# Patient Record
Sex: Female | Born: 1960 | Race: White | Hispanic: No | Marital: Married | Smoking: Never smoker
Health system: Southern US, Community
[De-identification: ages and names within clinical notes are randomized; demographics above are authoritative.]

## PROBLEM LIST (undated history)

## (undated) DIAGNOSIS — N870 Mild cervical dysplasia: Secondary | ICD-10-CM

## (undated) DIAGNOSIS — N61 Mastitis without abscess: Secondary | ICD-10-CM

## (undated) DIAGNOSIS — F1110 Opioid abuse, uncomplicated: Secondary | ICD-10-CM

## (undated) DIAGNOSIS — R739 Hyperglycemia, unspecified: Secondary | ICD-10-CM

## (undated) DIAGNOSIS — M810 Age-related osteoporosis without current pathological fracture: Secondary | ICD-10-CM

## (undated) HISTORY — PX: TUBAL LIGATION: SHX77

## (undated) HISTORY — PX: GANGLION CYST EXCISION: SHX1691

## (undated) HISTORY — PX: ROTATOR CUFF REPAIR: SHX139

## (undated) HISTORY — PX: KNEE ARTHROSCOPY: SUR90

## (undated) HISTORY — DX: Opioid abuse, uncomplicated: F11.10

## (undated) HISTORY — DX: Hyperglycemia, unspecified: R73.9

## (undated) HISTORY — PX: MANDIBLE SURGERY: SHX707

## (undated) HISTORY — DX: Mild cervical dysplasia: N87.0

## (undated) HISTORY — DX: Age-related osteoporosis without current pathological fracture: M81.0

---
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
## 1985-09-29 HISTORY — PX: GYNECOLOGIC CRYOSURGERY: SHX857

## 1998-02-13 ENCOUNTER — Ambulatory Visit (HOSPITAL_BASED_OUTPATIENT_CLINIC_OR_DEPARTMENT_OTHER): Admission: RE | Admit: 1998-02-13 | Discharge: 1998-02-13 | Payer: Self-pay | Admitting: Orthopedic Surgery

## 1998-06-14 ENCOUNTER — Other Ambulatory Visit: Admission: RE | Admit: 1998-06-14 | Discharge: 1998-06-14 | Payer: Self-pay | Admitting: Obstetrics and Gynecology

## 1998-12-24 ENCOUNTER — Encounter: Admission: RE | Admit: 1998-12-24 | Discharge: 1999-01-09 | Payer: Self-pay | Admitting: Orthopedic Surgery

## 1999-06-18 ENCOUNTER — Other Ambulatory Visit: Admission: RE | Admit: 1999-06-18 | Discharge: 1999-06-18 | Payer: Self-pay | Admitting: Obstetrics and Gynecology

## 1999-07-03 ENCOUNTER — Encounter: Payer: Self-pay | Admitting: Obstetrics and Gynecology

## 1999-07-03 ENCOUNTER — Ambulatory Visit (HOSPITAL_COMMUNITY): Admission: RE | Admit: 1999-07-03 | Discharge: 1999-07-03 | Payer: Self-pay | Admitting: Obstetrics and Gynecology

## 2000-02-24 ENCOUNTER — Encounter: Payer: Self-pay | Admitting: Obstetrics and Gynecology

## 2000-02-24 ENCOUNTER — Encounter: Admission: RE | Admit: 2000-02-24 | Discharge: 2000-02-24 | Payer: Self-pay | Admitting: Obstetrics and Gynecology

## 2000-06-18 ENCOUNTER — Other Ambulatory Visit: Admission: RE | Admit: 2000-06-18 | Discharge: 2000-06-18 | Payer: Self-pay | Admitting: Obstetrics and Gynecology

## 2000-07-06 ENCOUNTER — Encounter: Payer: Self-pay | Admitting: Obstetrics and Gynecology

## 2000-07-06 ENCOUNTER — Encounter: Admission: RE | Admit: 2000-07-06 | Discharge: 2000-07-06 | Payer: Self-pay | Admitting: Obstetrics and Gynecology

## 2001-06-28 ENCOUNTER — Other Ambulatory Visit: Admission: RE | Admit: 2001-06-28 | Discharge: 2001-06-28 | Payer: Self-pay | Admitting: Obstetrics and Gynecology

## 2001-07-07 ENCOUNTER — Encounter: Payer: Self-pay | Admitting: Obstetrics and Gynecology

## 2001-07-07 ENCOUNTER — Encounter: Admission: RE | Admit: 2001-07-07 | Discharge: 2001-07-07 | Payer: Self-pay | Admitting: Obstetrics and Gynecology

## 2002-01-03 ENCOUNTER — Encounter: Admission: RE | Admit: 2002-01-03 | Discharge: 2002-01-03 | Payer: Self-pay | Admitting: Obstetrics and Gynecology

## 2002-01-03 ENCOUNTER — Encounter: Payer: Self-pay | Admitting: Obstetrics and Gynecology

## 2002-06-28 ENCOUNTER — Other Ambulatory Visit: Admission: RE | Admit: 2002-06-28 | Discharge: 2002-06-28 | Payer: Self-pay | Admitting: Obstetrics and Gynecology

## 2002-07-11 ENCOUNTER — Encounter: Admission: RE | Admit: 2002-07-11 | Discharge: 2002-07-11 | Payer: Self-pay | Admitting: Obstetrics and Gynecology

## 2002-07-11 ENCOUNTER — Encounter: Payer: Self-pay | Admitting: Obstetrics and Gynecology

## 2002-07-18 ENCOUNTER — Encounter: Admission: RE | Admit: 2002-07-18 | Discharge: 2002-07-18 | Payer: Self-pay | Admitting: Orthopedic Surgery

## 2002-07-18 ENCOUNTER — Encounter: Payer: Self-pay | Admitting: Orthopedic Surgery

## 2002-09-28 ENCOUNTER — Encounter: Admission: RE | Admit: 2002-09-28 | Discharge: 2002-09-28 | Payer: Self-pay | Admitting: Orthopedic Surgery

## 2002-09-28 ENCOUNTER — Encounter: Payer: Self-pay | Admitting: Orthopedic Surgery

## 2003-05-29 ENCOUNTER — Encounter: Admission: RE | Admit: 2003-05-29 | Discharge: 2003-05-29 | Payer: Self-pay | Admitting: Obstetrics and Gynecology

## 2003-05-29 ENCOUNTER — Encounter: Payer: Self-pay | Admitting: Obstetrics and Gynecology

## 2003-06-30 ENCOUNTER — Other Ambulatory Visit: Admission: RE | Admit: 2003-06-30 | Discharge: 2003-06-30 | Payer: Self-pay | Admitting: Obstetrics and Gynecology

## 2004-06-07 ENCOUNTER — Encounter: Admission: RE | Admit: 2004-06-07 | Discharge: 2004-06-07 | Payer: Self-pay | Admitting: Obstetrics and Gynecology

## 2004-06-20 ENCOUNTER — Ambulatory Visit (HOSPITAL_COMMUNITY): Admission: RE | Admit: 2004-06-20 | Discharge: 2004-06-21 | Payer: Self-pay | Admitting: Oral & Maxillofacial Surgery

## 2004-07-02 ENCOUNTER — Other Ambulatory Visit: Admission: RE | Admit: 2004-07-02 | Discharge: 2004-07-02 | Payer: Self-pay | Admitting: Obstetrics and Gynecology

## 2005-06-17 ENCOUNTER — Encounter: Admission: RE | Admit: 2005-06-17 | Discharge: 2005-06-17 | Payer: Self-pay | Admitting: Obstetrics and Gynecology

## 2005-07-29 ENCOUNTER — Other Ambulatory Visit: Admission: RE | Admit: 2005-07-29 | Discharge: 2005-07-29 | Payer: Self-pay | Admitting: Obstetrics and Gynecology

## 2006-06-22 ENCOUNTER — Encounter: Admission: RE | Admit: 2006-06-22 | Discharge: 2006-06-22 | Payer: Self-pay | Admitting: Obstetrics and Gynecology

## 2006-09-09 ENCOUNTER — Other Ambulatory Visit: Admission: RE | Admit: 2006-09-09 | Discharge: 2006-09-09 | Payer: Self-pay | Admitting: Obstetrics and Gynecology

## 2007-06-28 ENCOUNTER — Encounter: Admission: RE | Admit: 2007-06-28 | Discharge: 2007-06-28 | Payer: Self-pay | Admitting: Obstetrics and Gynecology

## 2007-10-04 ENCOUNTER — Other Ambulatory Visit: Admission: RE | Admit: 2007-10-04 | Discharge: 2007-10-04 | Payer: Self-pay | Admitting: Obstetrics and Gynecology

## 2008-06-28 ENCOUNTER — Encounter: Admission: RE | Admit: 2008-06-28 | Discharge: 2008-06-28 | Payer: Self-pay | Admitting: Obstetrics and Gynecology

## 2008-10-16 ENCOUNTER — Other Ambulatory Visit: Admission: RE | Admit: 2008-10-16 | Discharge: 2008-10-16 | Payer: Self-pay | Admitting: Obstetrics and Gynecology

## 2009-07-19 ENCOUNTER — Encounter: Admission: RE | Admit: 2009-07-19 | Discharge: 2009-07-19 | Payer: Self-pay | Admitting: Obstetrics and Gynecology

## 2009-08-28 ENCOUNTER — Ambulatory Visit (HOSPITAL_COMMUNITY): Admission: RE | Admit: 2009-08-28 | Discharge: 2009-08-28 | Payer: Self-pay | Admitting: Orthopedic Surgery

## 2009-08-29 HISTORY — PX: SHOULDER ARTHROSCOPY: SHX128

## 2009-09-25 ENCOUNTER — Ambulatory Visit (HOSPITAL_BASED_OUTPATIENT_CLINIC_OR_DEPARTMENT_OTHER): Admission: RE | Admit: 2009-09-25 | Discharge: 2009-09-25 | Payer: Self-pay | Admitting: Orthopedic Surgery

## 2009-11-21 ENCOUNTER — Encounter: Admission: RE | Admit: 2009-11-21 | Discharge: 2009-11-21 | Payer: Self-pay | Admitting: Obstetrics and Gynecology

## 2010-06-13 ENCOUNTER — Encounter: Admission: RE | Admit: 2010-06-13 | Discharge: 2010-06-13 | Payer: Self-pay | Admitting: Obstetrics and Gynecology

## 2010-10-03 ENCOUNTER — Encounter
Admission: RE | Admit: 2010-10-03 | Discharge: 2010-10-03 | Payer: Self-pay | Source: Home / Self Care | Attending: Obstetrics and Gynecology | Admitting: Obstetrics and Gynecology

## 2010-10-20 ENCOUNTER — Encounter: Payer: Self-pay | Admitting: Obstetrics and Gynecology

## 2010-12-30 LAB — POCT HEMOGLOBIN-HEMACUE: Hemoglobin: 13.2 g/dL (ref 12.0–15.0)

## 2011-02-14 NOTE — H&P (Signed)
NAMECELIA, FRIEDLAND              ACCOUNT NO.:  [id number]   MEDICAL RECORD NO.:  [id number]          PATIENT TYPE:  OIB   LOCATION:  2550                         FACILITY:  [facility]   PHYSICIAN:  Dorthula Matas, D.D.S.DATE OF BIRTH:  1960-11-05   DATE OF ADMISSION:  06/20/2004  DATE OF DISCHARGE:                                HISTORY & PHYSICAL   Traci Hoffman is a 50 year old female well known to me.  [name] has  undergone orthodontic therapy and has had problems consistently with her  temporomandibular joint.  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  After talking with her in detail about possible  surgical solutions for the skeletal dysplasia, she has elected to proceed  with total maxillary LeFort I osteotomy of the maxillae tree position and  maxilla superiorly and also bilateral mandibular sagittal split osteotomies  of the mandible to advance and rotate the mandible.  She is aware of the  multiple risks which are associated with surgery which include but are not  limited to the following risks:  swelling; bruising; nasal congestion; sinus  difficulties; infection; bruising; relapse; need for further surgical or  orthodontic care; possible continuation of temporomandibular joint problems  which may even worsen or change; facial profile changes, nasal width  increase; interoral scarring; trismus; or limited mouth opening which may be  temporary or long term; numbness of the upper lip, chin, tongue, roof of  mouth, gum tissue, lower lip, which may be temporary or long term.  The  patient does desire to proceed with planned surgical procedure, and this  will be accomplished in [facility] today under general anesthesia.  She has had a presurgical History and Physical and is in good general health  to undergo the planned surgical procedure.       SWS/MEDQ  D:  06/20/2004  T:  06/20/2004  Job:  [id number]

## 2011-02-14 NOTE — Op Note (Signed)
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   PROCEDURE:  Total maxillary LeFort I osteotomy with rigid interfixation bone  plates; bilateral mandibular sagittal split osteotomies with rigid  intermaxillary fixation using bone plates and intermaxillary fixation with  elastics.   SURGEON:  Dorthula Matas, D.D.S.   ASSISTANT:  Griffith Citron Mohorn, D.D.S.   ANESTHESIA:  General anesthesia via nasoendotracheal tube.   CULTURES:  None.   DRAINS:  NG tube and Foley catheter.   SPECIMENS:  None.   COMPLICATIONS:  None.   ESTIMATED BLOOD LOSS:  500 mL.   DESCRIPTION OF PROCEDURE:  The patient was brought to the OR and placed on  the OR table in supine position.  She was then placed under general  anesthesia and nasoendotracheal tube and nasogastric tubes were inserted.  A  Foley catheter was also inserted.  The patient was maintained under general  anesthesia for an oral maxillofacial surgical procedure.  The patient was  first prepped and draped in the usual sterile fashion for an oral  maxillofacial surgical procedure.  0.5% Marcaine with 1:200,000 epinephrine  was used to infiltrate along the mandibular ramus on the right and left and  also used to infiltrate in the maxillary buccal vestibule and give right and  left greater palatine nerve blocks and nasal palatine nerve block.  After  this was accomplished, our attention was first directed to the mandibular  left.  A sagittal  split incision was made using the Bovie cautery in the  cutting mode through the unattached mucosa overlying the anterior aspect of  the mandibular ramus and extending into the buccal vestibule of the  mandible.  At this point, this incision was deepened until the periosteal  layer was encountered.  The periosteum was then incised with a 15 blade.  A  periosteal elevator was used to reflect the periosteum along the anterior  aspect of the mandibular ramus and also to create a medial tunnel along the  medial aspect of the mandibular ramus and a vertical tunnel along the  posterior aspect of the body of the mandible laterally.  At this point the  lingula was identified and the nerve was protected.  A Lindemann bur and a  [employer] drill was used to make the medial horizontal osteotomy cut of the  ramus just above the lingula through the medial cortical bone into bleeding  medullary bone.  A sidearm Fisher bur was then used to make the sagittal  osteotomy cut and a 703 Fisher bur was used to make the vertical osteotomy  cut in the posterior body of the mandible region from  the inferior border of  the mandible to the most anterior aspect of the sagittal cut.  Once these  cuts were completed, a half pack was placed and a similar incision,  dissection, and osteotomy cuts were made on the right side.  At this point,  our attention was directed to the maxilla where a circumvestibular incision  was made in the unattached mucosa of the maxillary vestibule starting at the  right zygomatic buttress area and extending to the left zygomatic buttress  area.  The periosteum was incised and a full-thickness mucoperiosteal flap  was reflected.  The infraorbital nerve was visualized bilaterally and was  protected.  The tissue around the anterior nasal spine was released and  dissection was carried out to release the nasal mucosa from the floor of the  nose and the lateral inferior aspect of the nose.  Also a  tunneling  technique was taken from the zygomatic buttress back to the pterygomaxillary  fissure region bilaterally.  At this point, vertical marking holes were made  in the piriform rim area and also in the zygomatic buttress areas  bilaterally with the inferior superior distance being 12 mm.  The  premaxillary ligament was released.  At this point, the horizontal osteotomy  cut of the lateral maxillary wall was made starting at the left zygomatic  buttress region and extending to the piriform rim area and this was also  made on the opposite side.  Using a reciprocating saw, the osteotomy cut was  taken from the right zygomatic buttress back to the pterygomaxillary fissure  region bilaterally.  At this point the nasal septal osteotome was placed at  the inferior aspect of the nasal septum and was released.  The lateral nasal  wall was released at it's inferior extent with a lateral nasal wall septal  osteotome.  The pterygomaxillary fissure was released using the  pterygomaxillary osteotome at it's inferior extent bilaterally.  At this  point the maxilla was down-fractured.  Once the maxilla was down-fractured,  it was further mobilized using the Tessier disimpaction forceps.  The  intermediate splint was placed between the teeth and intermaxillary fixation  was applied.  The maxilla was hinged using the condyles and the glenoid  fossa as the fulcrum until first bony contact was made.  Further bony  modifications were carried out until the maxilla had been superiorly  repositioned 5 mm.  Bone along the inferior aspect of the nasal septum was  smoothed using an alveoloplasty bur.  The greater palatine vessels were  intact bilaterally.  The sinuses were irrigated well.  The inferior extent  of the cartilaginous and bony septum was trimmed back approximately 5 mm.  The nasal tissue was then repaired over the nasal septum using 4-0 chromic suture.  At this point the maxilla was again hinged  into the new superior  position and was held in place and was bone-plated in this area.  KLS 1.5 mm  bone plates were used.  The bone plates were placed in the zygomatic  buttress region bilaterally and also in the piriform rim region bilaterally.  The area was again irrigated and suctioned free of debris.  Our attention  was then directed to the mandible where the sagittal split osteotomy was  completed using small osteotomes and increasing sizes of osteotomes until  the [facility] osteotome instruments were utilized.  The inferior alveolar nerve  was noted to be intact bilaterally.  The pterygomaxillary sling stripper was  used to release the  muscle attachments from the posterior aspect of the  distal segment bilaterally.  Once this was accomplished, all previous half  packs were removed.  The patient was placed into the final intermaxillary  surgical splint and 26 gauge IMF wire loops were used to secure the maxilla  to the mandible in the final splint.  At this point, the proximal segments  of the mandible were hinged posteriorly and superiorly and seated the  condyles in the glenoid fossa using a two-point technique.  A purchase point  had been placed in the anterior aspect of the proximal segment and also the  interosseous hole in the ramus section of the mandible.  Once the proximal  segment was seated, bone plate fixation was utilized using the 2 mm KLS six-  hole bone plates.  These secured the proximal and distal segments in the new  position.  Six bone screws were placed in each of the plates.  The area was  then irrigated copiously with normal saline and suctioned free of debris.  The intermaxillary fixation was released and occlusion was checked. The  occlusion was noted to be as planned on the final surgical models.  Since  there was such a good occlusion, we did not put the final splint in place,  but instead just placed the patient into intermaxillary fixation elastics.  The  sagittal split incisions were closed with 3-0 Vicryl suture placed in a  running continuous fashion bilaterally.  The maxillary incision was closed  first with transnasalis cinch suture of 2-0 Vicryl.  A VY closure was then  carried out of the maxillary incision with the leg of the Y being at the  midline of the lip.  4-0 Vicryl was used in a continuous fashion to provide  primary closure of the maxillary incision.  At this point, the surgical  procedure was completed, the intermaxillary fixation was released.  Again  the occlusion was checked.  The oral cavity was irrigated and suctioned  free of debris and the throat pack was removed.  Intermaxillary fixation  elastics were then reapplied.  The patient was awakened in the operating  room and transferred to the recovery room where she was found to be in  satisfactory postoperative condition.       SWS/MEDQ  D:  06/20/2004  T:  06/21/2004  Job:  [id number]

## 2011-09-12 ENCOUNTER — Other Ambulatory Visit: Payer: Self-pay | Admitting: Obstetrics and Gynecology

## 2011-09-12 DIAGNOSIS — Z1231 Encounter for screening mammogram for malignant neoplasm of breast: Secondary | ICD-10-CM

## 2011-10-06 ENCOUNTER — Ambulatory Visit
Admission: RE | Admit: 2011-10-06 | Discharge: 2011-10-06 | Disposition: A | Discharge status: Home / Self Care | Payer: Self-pay | Source: Ambulatory Visit | Attending: Obstetrics and Gynecology | Admitting: Obstetrics and Gynecology

## 2011-10-06 ENCOUNTER — Other Ambulatory Visit: Payer: Self-pay | Admitting: Obstetrics and Gynecology

## 2011-10-06 DIAGNOSIS — Z1231 Encounter for screening mammogram for malignant neoplasm of breast: Secondary | ICD-10-CM

## 2011-10-06 DIAGNOSIS — N63 Unspecified lump in unspecified breast: Secondary | ICD-10-CM

## 2011-10-13 ENCOUNTER — Ambulatory Visit
Admission: RE | Admit: 2011-10-13 | Discharge: 2011-10-13 | Disposition: A | Discharge status: Home / Self Care | Payer: Managed Care, Other (non HMO) | Source: Ambulatory Visit | Attending: Obstetrics and Gynecology | Admitting: Obstetrics and Gynecology

## 2011-10-13 ENCOUNTER — Ambulatory Visit
Admission: RE | Admit: 2011-10-13 | Discharge: 2011-10-13 | Disposition: A | Discharge status: Home / Self Care | Payer: Self-pay | Source: Ambulatory Visit | Attending: Obstetrics and Gynecology | Admitting: Obstetrics and Gynecology

## 2011-10-13 ENCOUNTER — Other Ambulatory Visit: Payer: Self-pay | Admitting: Obstetrics and Gynecology

## 2011-10-13 DIAGNOSIS — N63 Unspecified lump in unspecified breast: Secondary | ICD-10-CM

## 2012-03-11 ENCOUNTER — Other Ambulatory Visit: Payer: Self-pay | Admitting: Obstetrics and Gynecology

## 2012-03-11 DIAGNOSIS — N649 Disorder of breast, unspecified: Secondary | ICD-10-CM

## 2012-04-08 ENCOUNTER — Ambulatory Visit
Admission: RE | Admit: 2012-04-08 | Discharge: 2012-04-08 | Disposition: A | Discharge status: Home / Self Care | Payer: Managed Care, Other (non HMO) | Source: Ambulatory Visit | Attending: Obstetrics and Gynecology | Admitting: Obstetrics and Gynecology

## 2012-04-08 DIAGNOSIS — N649 Disorder of breast, unspecified: Secondary | ICD-10-CM

## 2012-09-02 ENCOUNTER — Other Ambulatory Visit: Payer: Self-pay | Admitting: Obstetrics and Gynecology

## 2012-09-02 DIAGNOSIS — N6009 Solitary cyst of unspecified breast: Secondary | ICD-10-CM

## 2012-10-13 ENCOUNTER — Ambulatory Visit
Admission: RE | Admit: 2012-10-13 | Discharge: 2012-10-13 | Disposition: A | Discharge status: Home / Self Care | Payer: Medicare HMO | Source: Ambulatory Visit | Attending: Obstetrics and Gynecology | Admitting: Obstetrics and Gynecology

## 2012-10-13 DIAGNOSIS — N6009 Solitary cyst of unspecified breast: Secondary | ICD-10-CM

## 2012-11-19 LAB — HM PAP SMEAR: HM Pap smear: NEGATIVE

## 2012-12-29 LAB — HM COLONOSCOPY

## 2013-09-23 ENCOUNTER — Other Ambulatory Visit: Payer: Self-pay

## 2013-09-23 DIAGNOSIS — Z1231 Encounter for screening mammogram for malignant neoplasm of breast: Secondary | ICD-10-CM

## 2013-10-19 ENCOUNTER — Ambulatory Visit
Admission: RE | Admit: 2013-10-19 | Discharge: 2013-10-19 | Disposition: A | Discharge status: Home / Self Care | Payer: Private Health Insurance - Indemnity | Source: Ambulatory Visit

## 2013-10-19 DIAGNOSIS — Z1231 Encounter for screening mammogram for malignant neoplasm of breast: Secondary | ICD-10-CM

## 2013-10-19 LAB — HM MAMMOGRAPHY

## 2013-11-23 ENCOUNTER — Ambulatory Visit: Payer: Self-pay | Admitting: Obstetrics and Gynecology

## 2013-11-24 ENCOUNTER — Ambulatory Visit: Payer: Self-pay | Admitting: Obstetrics and Gynecology

## 2013-11-30 ENCOUNTER — Ambulatory Visit (INDEPENDENT_AMBULATORY_CARE_PROVIDER_SITE_OTHER): Payer: Managed Care, Other (non HMO) | Admitting: Obstetrics and Gynecology

## 2013-11-30 ENCOUNTER — Encounter: Payer: Self-pay | Admitting: Obstetrics and Gynecology

## 2013-11-30 VITALS — BP 100/64 | HR 80 | Resp 16 | Ht 64.0 in | Wt 137.5 lb

## 2013-11-30 DIAGNOSIS — R7309 Other abnormal glucose: Secondary | ICD-10-CM

## 2013-11-30 DIAGNOSIS — Z23 Encounter for immunization: Secondary | ICD-10-CM

## 2013-11-30 DIAGNOSIS — Z01419 Encounter for gynecological examination (general) (routine) without abnormal findings: Secondary | ICD-10-CM

## 2013-11-30 DIAGNOSIS — Z0000 Encounter for general adult medical examination without abnormal findings: Secondary | ICD-10-CM

## 2013-11-30 DIAGNOSIS — R739 Hyperglycemia, unspecified: Secondary | ICD-10-CM

## 2013-11-30 LAB — LIPID PANEL
Cholesterol: 193 mg/dL (ref 0–200)
HDL: 52 mg/dL (ref >39)
LDL Cholesterol: 114 mg/dL — ABNORMAL HIGH (ref 0–99)
Total CHOL/HDL Ratio: 3.7 Ratio
Triglycerides: 136 mg/dL (ref <150)
VLDL: 27 mg/dL (ref 0–40)

## 2013-11-30 LAB — POCT URINALYSIS DIPSTICK
Bilirubin, UA: NEGATIVE
Blood, UA: NEGATIVE
Glucose, UA: NEGATIVE
Ketones, UA: NEGATIVE
Leukocytes, UA: NEGATIVE
Nitrite, UA: NEGATIVE
Protein, UA: NEGATIVE
Urobilinogen, UA: NEGATIVE
pH, UA: 5

## 2013-11-30 LAB — COMPREHENSIVE METABOLIC PANEL
ALT: 14 U/L (ref 0–35)
AST: 18 U/L (ref 0–37)
Albumin: 4.4 g/dL (ref 3.5–5.2)
Alkaline Phosphatase: 52 U/L (ref 39–117)
BUN: 19 mg/dL (ref 6–23)
CO2: 30 mEq/L (ref 19–32)
Calcium: 9.6 mg/dL (ref 8.4–10.5)
Chloride: 104 mEq/L (ref 96–112)
Creat: 0.73 mg/dL (ref 0.50–1.10)
Glucose, Bld: 91 mg/dL (ref 70–99)
Potassium: 4.2 mEq/L (ref 3.5–5.3)
Sodium: 142 mEq/L (ref 135–145)
Total Bilirubin: 0.3 mg/dL (ref 0.2–1.2)
Total Protein: 6.8 g/dL (ref 6.0–8.3)

## 2013-11-30 LAB — CBC
HCT: 36.4 % (ref 36.0–46.0)
Hemoglobin: 12.4 g/dL (ref 12.0–15.0)
MCH: 31.2 pg (ref 26.0–34.0)
MCHC: 34.1 g/dL (ref 30.0–36.0)
MCV: 91.7 fL (ref 78.0–100.0)
Platelets: 419 10*3/uL — ABNORMAL HIGH (ref 150–400)
RBC: 3.97 MIL/uL (ref 3.87–5.11)
RDW: 13.5 % (ref 11.5–15.5)
WBC: 6.9 10*3/uL (ref 4.0–10.5)

## 2013-11-30 LAB — HEMOGLOBIN, FINGERSTICK: Hemoglobin, fingerstick: 12.6 g/dL (ref 12.0–16.0)

## 2013-11-30 MED ORDER — ESTRADIOL 2 MG VA RING: 2.0000 mg | VAGINAL_RING | VAGINAL | Status: DC

## 2013-11-30 NOTE — Patient Instructions (Signed)

## 2013-11-30 NOTE — Progress Notes (Signed)
Patient ID: Traci Hoffman, female   DOB: 12-03-1960, 53 y.o.   MRN: [id number] GYNECOLOGY VISIT  PCP:   Merri Brunette, MD  Referring provider:   HPI: 53 y.o.   Married  Caucasian  female   [phone] with No LMP recorded. Patient is postmenopausal.   here for  AEX.  Hgb A1C was 5.8 in past and wants this rechecked along with cholesterol and Vit D level. Had borderline GDM with first pregnancy.  Wants to refill Estring to treat vaginal dryness.   Hgb:    12.6 Urine:   Neg  GYNECOLOGIC HISTORY: No LMP recorded. Patient is postmenopausal. Sexually active:  yes Partner preference: female Contraception:   Tubal/postmenopausal Menopausal hormone therapy:  none  DES exposure:   no Blood transfusions: no   Sexually transmitted diseases:   no GYN procedures and prior surgeries:  Tubal ligation, Cryotherapy to cervix Last mammogram:  10-19-13 wnl:[facility].  Has had ultrasounds of the left breast in 2013 and January 2014 which documented stability of the area.       Last pap and high risk HPV testing:  11-19-12 wnl:neg HR HPV  History of abnormal pap smear:  Hx abnormal pap with colposcopy and cryotherapy to cervix In 1987.  Paps normal since.   OB History   Grav Para Term Preterm Abortions TAB SAB Ect Mult Living   4 2 2  2  2   2        LIFESTYLE: Exercise:  running           Tobacco: no Alcohol:    no Drug use: no   OTHER HEALTH MAINTENANCE: Tetanus/TDap:   2002 Gardisil:               n/a Influenza:             06/2013 Zostavax:              n/a  Bone density:       n/a Colonoscopy:      12-29-12 wnl with Dr. Loreta Ave.  Next colonoscopy due 12/2022.  Cholesterol check:   unsure  Family History  Problem Relation Age of Onset  . Adopted: Yes  . Family history unknown: Yes    There are no active problems to display for this patient.  Past Medical History  Diagnosis Date  . Narcotic abuse     --Percocet and Vicodin abuse    Past Surgical History  Procedure  Laterality Date  . Tubal ligation    . Shoulder arthroscopy Right 08/2009  . Rotator cuff repair Left   . Mandible surgery    . Gynecologic cryosurgery  1987    CIN I    ALLERGIES: Review of patient's allergies indicates no known allergies.  Current Outpatient Prescriptions  Medication Sig Dispense Refill  . calcium carbonate (OS-CAL) 600 MG TABS tablet Take 600 mg by mouth 2 (two) times daily with a meal.      . estradiol (ESTRING) 2 MG vaginal ring Place 2 mg vaginally every 3 (three) months. follow package directions      . Multiple Vitamin (MULTIVITAMIN) capsule Take 1 capsule by mouth daily.       No current facility-administered medications for this visit.     ROS:  Pertinent items are noted in HPI.  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  Becoming part of [employer].   PHYSICAL EXAMINATION:    BP 100/64  Pulse 80  Resp 16  Ht 5\' 4"  (1.626 m)  Wt 137 lb 8 oz (62.37 kg)  BMI 23.59 kg/m2   Wt Readings from Last 3 Encounters:  11/30/13 137 lb 8 oz (62.37 kg)     Ht Readings from Last 3 Encounters:  11/30/13 5\' 4"  (1.626 m)    General appearance: alert, cooperative and appears stated age Head: Normocephalic, without obvious abnormality, atraumatic Neck: no adenopathy, supple, symmetrical, trachea midline and thyroid not enlarged, symmetric, no tenderness/mass/nodules Lungs: clear to auscultation bilaterally Breasts: Inspection negative, No nipple retraction or dimpling, No nipple discharge or bleeding, No axillary or supraclavicular adenopathy, Normal to palpation without dominant masses on the right.  Left breast with 4 - 5 mm firm mass at 2 - 3 o'clock position.   Heart: regular rate and rhythm Abdomen: soft, non-tender; no masses,  no organomegaly Extremities: extremities normal, atraumatic, no cyanosis or edema Skin: Skin color, texture, turgor normal. No rashes or lesions Lymph nodes: Cervical, supraclavicular,  and axillary nodes normal. No abnormal inguinal nodes palpated Neurologic: Grossly normal  Pelvic: External genitalia:  no lesions              Urethra:  normal appearing urethra with no masses, tenderness or lesions              Bartholins and Skenes: normal                 Vagina: normal appearing vagina with normal color and discharge, no lesions              Cervix: normal appearance              Pap and high risk HPV testing done: no.            Bimanual Exam:  Uterus:  uterus is normal size, shape, consistency and nontender                                      Adnexa: normal adnexa in size, nontender and no masses                                      Rectovaginal: Confirms                                      Anus:  normal sphincter tone, no lesions  ASSESSMENT  Normal gynecologic exam. Estring patient.  Left breast mass - stable by ultrasound exams.  Remote history of cryotherapy to cervix.   PLAN  Mammogram recommended yearly.  Continue with self exams.  Pap smear and high risk HPV testing not indicated.  Check Lipid profile, CMP, HgbA1C, CBC, Vit D.  Tdap today.  Counseled on self breast exam, Calcium and vitamin D intake, exercise. Ok to refill Estring for one year.  No contraindications.  Return annually or prn   An After Visit Summary was printed and given to the patient.

## 2013-12-01 LAB — VITAMIN D 25 HYDROXY (VIT D DEFICIENCY, FRACTURES): Vit D, 25-Hydroxy: 52 ng/mL (ref 30–89)

## 2013-12-01 LAB — HEMOGLOBIN A1C
Hgb A1c MFr Bld: 6 % — ABNORMAL HIGH (ref <5.7)
Mean Plasma Glucose: 126 mg/dL — ABNORMAL HIGH (ref <117)

## 2014-04-12 ENCOUNTER — Encounter: Payer: Self-pay | Admitting: Podiatrist

## 2014-04-12 ENCOUNTER — Ambulatory Visit (INDEPENDENT_AMBULATORY_CARE_PROVIDER_SITE_OTHER): Payer: Managed Care, Other (non HMO) | Admitting: Podiatrist

## 2014-04-12 ENCOUNTER — Ambulatory Visit (INDEPENDENT_AMBULATORY_CARE_PROVIDER_SITE_OTHER): Payer: Managed Care, Other (non HMO)

## 2014-04-12 VITALS — BP 124/74 | HR 75 | Resp 18

## 2014-04-12 DIAGNOSIS — M722 Plantar fascial fibromatosis: Secondary | ICD-10-CM

## 2014-04-12 DIAGNOSIS — R52 Pain, unspecified: Secondary | ICD-10-CM

## 2014-04-12 MED ORDER — TRIAMCINOLONE ACETONIDE 10 MG/ML IJ SUSP
10.0000 mg | Freq: Once | INTRAMUSCULAR | Status: AC
  Administered 2014-04-12: 10 mg

## 2014-04-12 NOTE — Patient Instructions (Signed)

## 2014-04-12 NOTE — Progress Notes (Signed)
   Subjective:    Patient ID: Traci Hoffman, female    DOB: 1961/01/03, 53 y.o.   MRN: [id number]  HPI my right heel has been going on for about 3 weeks and burns and throbs and sore and tender and hurts on side and on the bottom and the left foot I rolled it and bruised up and rolled off a pair of shoes and that was 2 weeks ago    Review of Systems  Musculoskeletal: Positive for gait problem.       Objective:   Physical Exam Patient is awake, alert, and oriented x 3.  In no acute distress.  Vascular status is intact with palpable pedal pulses at 2/4 DP and PT bilateral and capillary refill time within normal limits. Neurological sensation is also intact bilaterally via Semmes Weinstein monofilament at 5/5 sites. Light touch, vibratory sensation, Achilles tendon reflex is intact. Dermatological exam reveals skin color, turger and texture as normal. No open lesions present.  Musculature intact with dorsiflexion, plantarflexion, inversion, eversion. Mild discomfort present lateral left ankle.   Pain on palpation plantar medial aspect of the right heel is present.  Pain in the arch region is also present. xrays show no heel spur right.  Slight heel spur right.  No sign of injury on the left foot or ankle.    Assessment & Plan:  Plantar fasciitis right, sprain left  Plan:  Injected the plantar fascia for injection number 1 with kenalog and marcaine plain under sterile technique.  She was dispensed a removable straping and scanned for orthotics.  She will be seen back when these are ready for pickup.  Advised on shoe gear changes and stretches.

## 2014-05-05 ENCOUNTER — Ambulatory Visit (INDEPENDENT_AMBULATORY_CARE_PROVIDER_SITE_OTHER): Payer: Managed Care, Other (non HMO) | Admitting: *Deleted

## 2014-05-05 DIAGNOSIS — M722 Plantar fascial fibromatosis: Secondary | ICD-10-CM

## 2014-05-05 NOTE — Patient Instructions (Signed)

## 2014-05-05 NOTE — Progress Notes (Signed)
   Subjective:    Patient ID: Traci Hoffman, female    DOB: 05-Apr-1961, 53 y.o.   MRN: [id number]  HPI PUO AND GIVEN INSTRUCTION.   Review of Systems     Objective:   Physical Exam        Assessment & Plan:

## 2014-05-24 ENCOUNTER — Encounter: Payer: Self-pay | Admitting: Podiatrist

## 2014-05-24 ENCOUNTER — Ambulatory Visit (INDEPENDENT_AMBULATORY_CARE_PROVIDER_SITE_OTHER): Payer: Managed Care, Other (non HMO) | Admitting: Podiatrist

## 2014-05-24 VITALS — BP 124/75 | HR 85 | Resp 16

## 2014-05-24 DIAGNOSIS — M722 Plantar fascial fibromatosis: Secondary | ICD-10-CM

## 2014-05-24 MED ORDER — METHYLPREDNISOLONE (PAK) 4 MG PO TABS: ORAL_TABLET | ORAL | Status: DC

## 2014-05-24 MED ORDER — TRIAMCINOLONE ACETONIDE 10 MG/ML IJ SUSP
10.0000 mg | Freq: Once | INTRAMUSCULAR | Status: AC
  Administered 2014-05-24: 10 mg

## 2014-05-24 NOTE — Progress Notes (Signed)
   Subjective: Traci Hoffman presents today for followup of right heel pain. She states that the orthotics feel too low in the arch on her right foot and too high on her left. She states that she still runs a lot and that she wishes to continue to do so. She relates that the injection helped however she continues to run and the pain has returned.  Objective: Neurovascular status is intact and unchanged with palpable pedal pulses and neurological sensation intact. Pain on palpation plantar medial aspect of the right heel is noted. Orthotics are evaluated and appear to be in okay fit however subjectively she relates there uncomfortable.  Assessment: Plantar fasciitis right  Plan: Injected the plantar fascia with Kenalog and Marcaine mixture for injection #2. We will adjust the orthotics for her. She will be seen back when these are ready for pick up. Also wrote a prescription for her to see Ellamae Sia for physical therapy. This will likely benefit her  as she does continue to want to run.

## 2014-06-21 ENCOUNTER — Telehealth: Payer: Self-pay | Admitting: *Deleted

## 2014-06-21 NOTE — Telephone Encounter (Signed)
I saw Dr. Irving Shows August 26th.  They were readjusting my inserts and I have not receive a call back that they were ready.  I'm just checking on that.  Please give me a call.

## 2014-07-07 ENCOUNTER — Ambulatory Visit: Payer: Managed Care, Other (non HMO)

## 2014-07-07 DIAGNOSIS — M722 Plantar fascial fibromatosis: Secondary | ICD-10-CM

## 2014-07-07 NOTE — Patient Instructions (Signed)
Pt is here to PUO 

## 2014-07-07 NOTE — Progress Notes (Signed)
Pt is here to PUO 

## 2014-07-20 ENCOUNTER — Telehealth: Payer: Self-pay | Admitting: Obstetrics and Gynecology

## 2014-07-20 NOTE — Telephone Encounter (Signed)
Left message regarding upcoming appointment has been canceled and needs to be rescheduled. °

## 2014-07-20 NOTE — Telephone Encounter (Signed)
Left message appointment doesn't need to be rescheduled. Error.

## 2014-07-31 ENCOUNTER — Encounter: Payer: Self-pay | Admitting: Podiatrist

## 2014-09-15 ENCOUNTER — Other Ambulatory Visit: Payer: Self-pay

## 2014-09-15 DIAGNOSIS — Z1231 Encounter for screening mammogram for malignant neoplasm of breast: Secondary | ICD-10-CM

## 2014-10-20 ENCOUNTER — Ambulatory Visit: Payer: Private Health Insurance - Indemnity

## 2014-10-26 ENCOUNTER — Ambulatory Visit
Admission: RE | Admit: 2014-10-26 | Discharge: 2014-10-26 | Disposition: A | Discharge status: Home / Self Care | Payer: 59 | Source: Ambulatory Visit

## 2014-10-26 DIAGNOSIS — Z1231 Encounter for screening mammogram for malignant neoplasm of breast: Secondary | ICD-10-CM

## 2014-12-06 ENCOUNTER — Ambulatory Visit (INDEPENDENT_AMBULATORY_CARE_PROVIDER_SITE_OTHER): Payer: Managed Care, Other (non HMO) | Admitting: Obstetrics and Gynecology

## 2014-12-06 ENCOUNTER — Ambulatory Visit: Payer: Managed Care, Other (non HMO) | Admitting: Obstetrics and Gynecology

## 2014-12-06 ENCOUNTER — Encounter: Payer: Self-pay | Admitting: Obstetrics and Gynecology

## 2014-12-06 VITALS — BP 120/70 | HR 68 | Resp 12 | Ht 63.75 in | Wt 139.2 lb

## 2014-12-06 DIAGNOSIS — Z0000 Encounter for general adult medical examination without abnormal findings: Secondary | ICD-10-CM

## 2014-12-06 DIAGNOSIS — Z01419 Encounter for gynecological examination (general) (routine) without abnormal findings: Secondary | ICD-10-CM

## 2014-12-06 LAB — COMPREHENSIVE METABOLIC PANEL
ALT: 12 U/L (ref 0–35)
AST: 16 U/L (ref 0–37)
Albumin: 4.2 g/dL (ref 3.5–5.2)
Alkaline Phosphatase: 57 U/L (ref 39–117)
BUN: 18 mg/dL (ref 6–23)
CO2: 27 mEq/L (ref 19–32)
Calcium: 9.5 mg/dL (ref 8.4–10.5)
Chloride: 101 mEq/L (ref 96–112)
Creat: 0.68 mg/dL (ref 0.50–1.10)
Glucose, Bld: 89 mg/dL (ref 70–99)
Potassium: 4.2 mEq/L (ref 3.5–5.3)
Sodium: 139 mEq/L (ref 135–145)
Total Bilirubin: 0.3 mg/dL (ref 0.2–1.2)
Total Protein: 6.9 g/dL (ref 6.0–8.3)

## 2014-12-06 LAB — POCT URINALYSIS DIPSTICK
Bilirubin, UA: NEGATIVE
Blood, UA: NEGATIVE
Glucose, UA: NEGATIVE
Ketones, UA: NEGATIVE
Leukocytes, UA: NEGATIVE
Nitrite, UA: NEGATIVE
Protein, UA: NEGATIVE
Urobilinogen, UA: NEGATIVE
pH, UA: 5

## 2014-12-06 LAB — CBC
HCT: 37.5 % (ref 36.0–46.0)
Hemoglobin: 12.7 g/dL (ref 12.0–15.0)
MCH: 31.4 pg (ref 26.0–34.0)
MCHC: 33.9 g/dL (ref 30.0–36.0)
MCV: 92.8 fL (ref 78.0–100.0)
MPV: 9.6 fL (ref 8.6–12.4)
Platelets: 312 10*3/uL (ref 150–400)
RBC: 4.04 MIL/uL (ref 3.87–5.11)
RDW: 13.1 % (ref 11.5–15.5)
WBC: 5.8 10*3/uL (ref 4.0–10.5)

## 2014-12-06 LAB — LIPID PANEL
Cholesterol: 192 mg/dL (ref 0–200)
HDL: 62 mg/dL (ref >=46)
LDL Cholesterol: 106 mg/dL — ABNORMAL HIGH (ref 0–99)
Total CHOL/HDL Ratio: 3.1 Ratio
Triglycerides: 122 mg/dL (ref <150)
VLDL: 24 mg/dL (ref 0–40)

## 2014-12-06 MED ORDER — ESTROGENS, CONJUGATED 0.625 MG/GM VA CREA: 1.0000 | TOPICAL_CREAM | Freq: Every day | VAGINAL | Status: DC

## 2014-12-06 NOTE — Addendum Note (Signed)
Addended by: Elisha HeadlandNIX, Lennyn Gange S on: 12/06/2014 05:12 PM   Modules accepted: Orders, SmartSet

## 2014-12-06 NOTE — Progress Notes (Addendum)
54 y.o. [id number] MarriedCaucasianF here for annual exam.    Using Estring. Wants to continue with this.    Is a runner.   Patient's last menstrual period was 09/29/2005.          Sexually active: Yes.    The current method of family planning is tubal ligation.    Exercising: Yes.    running Smoker:  no  Health Maintenance: Pap:  11/19/12 WNL/negative HR HPV History of abnormal Pap:  Yes h/o abnormal pap with colposcopy and cryotherapy in 1987, paps normal since MMG:  10/26/14 3D-normal Colonoscopy:  2014-repeat in 10 years BMD:   none TDaP:  06/19/13 and 2015 Screening Labs: requests CBC, A1C, Hb today: today, Urine today: negative    reports that she has never smoked. She has never used smokeless tobacco. She reports that she does not drink alcohol or use illicit drugs.  Past Medical History  Diagnosis Date  . Narcotic abuse     --Percocet and Vicodin abuse  . Elevated blood sugar     Past Surgical History  Procedure Laterality Date  . Tubal ligation    . Shoulder arthroscopy Right 08/2009  . Rotator cuff repair Left   . Mandible surgery    . Gynecologic cryosurgery  1987    CIN I    Current Outpatient Prescriptions  Medication Sig Dispense Refill  . calcium carbonate (OS-CAL) 600 MG TABS tablet Take 600 mg by mouth 2 (two) times daily with a meal. With Vitamin D    . estradiol (ESTRING) 2 MG vaginal ring Place 2 mg vaginally every 3 (three) months. follow package directions 1 each 3  . Multiple Vitamin (MULTIVITAMIN) capsule Take 1 capsule by mouth daily.    Marland Kitchen conjugated estrogens (PREMARIN) vaginal cream Place 1 Applicatorful vaginally daily. Use 1/2 g vaginally every night at bed time for the first 2 weeks, then use 1/2 g vaginally two or three times per week as needed to maintain symptom relief. 30 g 4   No current facility-administered medications for this visit.    Family History  Problem Relation Age of Onset  . Adopted: Yes    ROS:  Pertinent items are  noted in HPI.  Otherwise, a comprehensive ROS was negative.  Exam:   BP 120/70 mmHg  Pulse 68  Resp 12  Ht 5' 3.75" (1.619 m)  Wt 139 lb 3.2 oz (63.141 kg)  BMI 24.09 kg/m2  LMP 09/29/2005      Height: 5' 3.75" (161.9 cm)  Ht Readings from Last 3 Encounters:  12/06/14 5' 3.75" (1.619 m)  11/30/13  (1.626 m)    General appearance: alert, cooperative and appears stated age Head: Normocephalic, without obvious abnormality, atraumatic Neck: no adenopathy, supple, symmetrical, trachea midline and thyroid normal to inspection and palpation Lungs: clear to auscultation bilaterally Breasts: normal appearance, no masses or tenderness, Inspection negative, No nipple retraction or dimpling, No nipple discharge or bleeding, No axillary or supraclavicular adenopathy Heart: regular rate and rhythm Abdomen: soft, non-tender; bowel sounds normal; no masses,  no organomegaly Extremities: extremities normal, atraumatic, no cyanosis or edema Skin: Skin color, texture, turgor normal. No rashes or lesions Lymph nodes: Cervical, supraclavicular, and axillary nodes normal. No abnormal inguinal nodes palpated Neurologic: Grossly normal   Pelvic: External genitalia:  no lesions              Urethra:  normal appearing urethra with no masses, tenderness or lesions  Bartholins and Skenes: normal                 Vagina: normal appearing vagina with normal color and discharge, no lesions. Estring in place.               Cervix: no lesions              Pap taken: No. Bimanual Exam:  Uterus:  normal size, contour, position, consistency, mobility, non-tender              Adnexa: normal adnexa and no mass, fullness, tenderness               Rectovaginal: Confirms               Anus:  normal sphincter tone, no lesions  Chaperone was present for exam.  A:  Well Woman with normal exam Atrophic vaginal symptoms.  History of borderline gestational diabetes.   P:   Mammogram yearly.  pap  smear not indicated.  Will switch to premarin vaginal cream.  See orders.  Instructed in use.  Discussed risks of DVT, PE, MI,and stroke.  Routine labs. return annually or prn

## 2014-12-06 NOTE — Patient Instructions (Signed)

## 2014-12-07 ENCOUNTER — Encounter: Payer: Self-pay | Admitting: Obstetrics and Gynecology

## 2014-12-07 LAB — HEMOGLOBIN A1C
Hgb A1c MFr Bld: 5.9 % — ABNORMAL HIGH (ref <5.7)
Mean Plasma Glucose: 123 mg/dL — ABNORMAL HIGH (ref <117)

## 2015-01-19 ENCOUNTER — Other Ambulatory Visit: Payer: Self-pay | Admitting: Obstetrics and Gynecology

## 2015-01-19 NOTE — Telephone Encounter (Signed)
Medication refill request: Estring Last AEX:  12/06/14 Dr. Edward JollySilva Next AEX: 12/19/15 Dr. Edward JollySilva Last MMG (if hormonal medication request): 10/26/14 [id number]:Neg Refill authorized: 11/30/13 #1each / 3 Refills to [facility]   today please advise.

## 2015-01-19 NOTE — Telephone Encounter (Signed)
Called patient she states she is not using Rx anymore.  Rx denied.

## 2015-01-19 NOTE — Telephone Encounter (Signed)
Please contact patient to understand if she wants the refill of the Estring.  She switched from Estring to vaginal estrogen cream instead last year.

## 2015-05-14 ENCOUNTER — Other Ambulatory Visit: Payer: Self-pay | Admitting: Orthopedic Surgery

## 2015-05-14 DIAGNOSIS — M79672 Pain in left foot: Secondary | ICD-10-CM

## 2015-05-16 ENCOUNTER — Ambulatory Visit
Admission: RE | Admit: 2015-05-16 | Discharge: 2015-05-16 | Disposition: A | Discharge status: Home / Self Care | Payer: 59 | Source: Ambulatory Visit | Attending: Orthopedic Surgery | Admitting: Orthopedic Surgery

## 2015-05-16 ENCOUNTER — Other Ambulatory Visit (HOSPITAL_BASED_OUTPATIENT_CLINIC_OR_DEPARTMENT_OTHER): Payer: Self-pay | Admitting: Orthopedic Surgery

## 2015-05-16 DIAGNOSIS — M79672 Pain in left foot: Secondary | ICD-10-CM

## 2015-05-18 ENCOUNTER — Encounter (HOSPITAL_COMMUNITY)
Admission: RE | Admit: 2015-05-18 | Discharge: 2015-05-18 | Disposition: A | Discharge status: Home / Self Care | Payer: Managed Care, Other (non HMO) | Source: Ambulatory Visit | Attending: Orthopedic Surgery | Admitting: Orthopedic Surgery

## 2015-05-18 ENCOUNTER — Encounter (HOSPITAL_COMMUNITY): Payer: Self-pay

## 2015-05-18 DIAGNOSIS — X58XXXA Exposure to other specified factors, initial encounter: Secondary | ICD-10-CM | POA: Insufficient documentation

## 2015-05-18 DIAGNOSIS — Z01812 Encounter for preprocedural laboratory examination: Secondary | ICD-10-CM | POA: Diagnosis present

## 2015-05-18 DIAGNOSIS — S92002A Unspecified fracture of left calcaneus, initial encounter for closed fracture: Secondary | ICD-10-CM | POA: Insufficient documentation

## 2015-05-18 LAB — COMPREHENSIVE METABOLIC PANEL
ALT: 34 U/L (ref 14–54)
AST: 37 U/L (ref 15–41)
Albumin: 3.8 g/dL (ref 3.5–5.0)
Alkaline Phosphatase: 55 U/L (ref 38–126)
Anion gap: 9 (ref 5–15)
BUN: 11 mg/dL (ref 6–20)
CO2: 28 mmol/L (ref 22–32)
Calcium: 9.2 mg/dL (ref 8.9–10.3)
Chloride: 103 mmol/L (ref 101–111)
Creatinine, Ser: 0.79 mg/dL (ref 0.44–1.00)
GFR calc Af Amer: >60 mL/min (ref >60)
GFR calc non Af Amer: >60 mL/min (ref >60)
Glucose, Bld: 109 mg/dL — ABNORMAL HIGH (ref 65–99)
Potassium: 4.1 mmol/L (ref 3.5–5.1)
Sodium: 140 mmol/L (ref 135–145)
Total Bilirubin: 0.5 mg/dL (ref 0.3–1.2)
Total Protein: 6.7 g/dL (ref 6.5–8.1)

## 2015-05-18 LAB — SURGICAL PCR SCREEN
MRSA, PCR: NEGATIVE
Staphylococcus aureus: NEGATIVE

## 2015-05-18 LAB — CBC
HCT: 36.6 % (ref 36.0–46.0)
Hemoglobin: 12.2 g/dL (ref 12.0–15.0)
MCH: 31.6 pg (ref 26.0–34.0)
MCHC: 33.3 g/dL (ref 30.0–36.0)
MCV: 94.8 fL (ref 78.0–100.0)
Platelets: 294 10*3/uL (ref 150–400)
RBC: 3.86 MIL/uL — ABNORMAL LOW (ref 3.87–5.11)
RDW: 12.3 % (ref 11.5–15.5)
WBC: 4.4 10*3/uL (ref 4.0–10.5)

## 2015-05-18 NOTE — Pre-Procedure Instructions (Signed)
Traci Hoffman  05/18/2015      [id number] [address] [place] [address] Phone: [phone] Fax: [phone]    Your procedure is scheduled on  Tuesday 05/22/15  Report to [facility] Admitting at 850 A.M.  Call this number if you have problems the morning of surgery:  [phone]   Remember:  Do not eat food or drink liquids after midnight.  Take these medicines the morning of surgery with A SIP OF WATER  TYLENOL OR TRAMADOL IF NEEDED (STOP MULTIVITAMIN)    Do not wear jewelry, make-up or nail polish.  Do not wear lotions, powders, or perfumes.  You may wear deodorant.  Do not shave 48 hours prior to surgery.  Men may shave face and neck.  Do not bring valuables to the hospital.  [facility] is not responsible for any belongings or valuables.  Contacts, dentures or bridgework may not be worn into surgery.  Leave your suitcase in the car.  After surgery it may be brought to your room.  For patients admitted to the hospital, discharge time will be determined by your treatment team.  Patients discharged the day of surgery will not be allowed to drive home.   Name and phone number of your driver:    Special instructions:  [facility] - Preparing for Surgery  Before surgery, you can play an important role.  Because skin is not sterile, your skin needs to be as free of germs as possible.  You can reduce the number of germs on you skin by washing with CHG (chlorahexidine gluconate) soap before surgery.  CHG is an antiseptic cleaner which kills germs and bonds with the skin to continue killing germs even after washing.  Please DO NOT use if you have an allergy to CHG or antibacterial soaps.  If your skin becomes reddened/irritated stop using the CHG and inform your nurse when you arrive at Short Stay.  Do not shave (including legs and underarms) for at least 48 hours prior to the first CHG shower.  You may shave your  face.  Please follow these instructions carefully:   1.  Shower with CHG Soap the night before surgery and the                                morning of Surgery.  2.  If you choose to wash your hair, wash your hair first as usual with your       normal shampoo.  3.  After you shampoo, rinse your hair and body thoroughly to remove the                      Shampoo.  4.  Use CHG as you would any other liquid soap.  You can apply chg directly       to the skin and wash gently with scrungie or a clean washcloth.  5.  Apply the CHG Soap to your body ONLY FROM THE NECK DOWN.        Do not use on open wounds or open sores.  Avoid contact with your eyes,       ears, mouth and genitals (private parts).  Wash genitals (private parts)       with your normal soap.  6.  Wash thoroughly, paying special attention to the area where your surgery  will be performed.  7.  Thoroughly rinse your body with warm water from the neck down.  8.  DO NOT shower/wash with your normal soap after using and rinsing off       the CHG Soap.  9.  Pat yourself dry with a clean towel.            10.  Wear clean pajamas.            11.  Place clean sheets on your bed the night of your first shower and do not        sleep with pets.  Day of Surgery  Do not apply any lotions/deoderants the morning of surgery.  Please wear clean clothes to the hospital/surgery center.    Please read over the following fact sheets that you were given. Pain Booklet, Coughing and Deep Breathing, MRSA Information and Surgical Site Infection Prevention

## 2015-05-21 MED ORDER — CEFAZOLIN SODIUM-DEXTROSE 2-3 GM-% IV SOLR
2.0000 g | INTRAVENOUS | Status: AC
  Administered 2015-05-22: 2 g via INTRAVENOUS
  Filled 2015-05-21: qty 50

## 2015-05-22 ENCOUNTER — Ambulatory Visit (HOSPITAL_COMMUNITY): Payer: Managed Care, Other (non HMO) | Admitting: Anesthesiology

## 2015-05-22 ENCOUNTER — Encounter (HOSPITAL_COMMUNITY)
Admission: RE | Disposition: A | Discharge status: Home / Self Care | Payer: Self-pay | Source: Ambulatory Visit | Attending: Orthopedic Surgery

## 2015-05-22 ENCOUNTER — Ambulatory Visit (HOSPITAL_COMMUNITY): Payer: Managed Care, Other (non HMO)

## 2015-05-22 ENCOUNTER — Observation Stay (HOSPITAL_COMMUNITY)
Admission: RE | Admit: 2015-05-22 | Discharge: 2015-05-23 | Disposition: A | Discharge status: Home / Self Care | Payer: Managed Care, Other (non HMO) | Source: Ambulatory Visit | Attending: Orthopedic Surgery | Admitting: Orthopedic Surgery

## 2015-05-22 ENCOUNTER — Observation Stay (HOSPITAL_COMMUNITY): Payer: Managed Care, Other (non HMO)

## 2015-05-22 ENCOUNTER — Encounter (HOSPITAL_COMMUNITY): Payer: Self-pay | Admitting: Anesthesiology

## 2015-05-22 DIAGNOSIS — Z8781 Personal history of (healed) traumatic fracture: Secondary | ICD-10-CM

## 2015-05-22 DIAGNOSIS — Y9289 Other specified places as the place of occurrence of the external cause: Secondary | ICD-10-CM | POA: Insufficient documentation

## 2015-05-22 DIAGNOSIS — R200 Anesthesia of skin: Secondary | ICD-10-CM | POA: Diagnosis not present

## 2015-05-22 DIAGNOSIS — Z419 Encounter for procedure for purposes other than remedying health state, unspecified: Secondary | ICD-10-CM

## 2015-05-22 DIAGNOSIS — S92002A Unspecified fracture of left calcaneus, initial encounter for closed fracture: Principal | ICD-10-CM | POA: Diagnosis present

## 2015-05-22 DIAGNOSIS — X58XXXA Exposure to other specified factors, initial encounter: Secondary | ICD-10-CM | POA: Insufficient documentation

## 2015-05-22 DIAGNOSIS — Y9301 Activity, walking, marching and hiking: Secondary | ICD-10-CM | POA: Diagnosis not present

## 2015-05-22 DIAGNOSIS — Z9889 Other specified postprocedural states: Secondary | ICD-10-CM

## 2015-05-22 DIAGNOSIS — Z79899 Other long term (current) drug therapy: Secondary | ICD-10-CM | POA: Diagnosis not present

## 2015-05-22 HISTORY — PX: ORIF CALCANEOUS FRACTURE: SHX5030

## 2015-05-22 LAB — CREATININE, SERUM
Creatinine, Ser: 0.72 mg/dL (ref 0.44–1.00)
GFR calc Af Amer: >60 mL/min (ref >60)
GFR calc non Af Amer: >60 mL/min (ref >60)

## 2015-05-22 LAB — CBC
HCT: 34.6 % — ABNORMAL LOW (ref 36.0–46.0)
Hemoglobin: 11.8 g/dL — ABNORMAL LOW (ref 12.0–15.0)
MCH: 32 pg (ref 26.0–34.0)
MCHC: 34.1 g/dL (ref 30.0–36.0)
MCV: 93.8 fL (ref 78.0–100.0)
Platelets: 353 10*3/uL (ref 150–400)
RBC: 3.69 MIL/uL — ABNORMAL LOW (ref 3.87–5.11)
RDW: 12.3 % (ref 11.5–15.5)
WBC: 7.8 10*3/uL (ref 4.0–10.5)

## 2015-05-22 SURGERY — OPEN REDUCTION INTERNAL FIXATION (ORIF) CALCANEOUS FRACTURE
Anesthesia: General | Site: Foot | Laterality: Left

## 2015-05-22 MED ORDER — MIDAZOLAM HCL 5 MG/5ML IJ SOLN
INTRAMUSCULAR | Status: DC | PRN
  Administered 2015-05-22 (×2): 1 mg via INTRAVENOUS

## 2015-05-22 MED ORDER — BISACODYL 10 MG RE SUPP: 10.0000 mg | Freq: Every day | RECTAL | Status: DC | PRN

## 2015-05-22 MED ORDER — NEOSTIGMINE METHYLSULFATE 10 MG/10ML IV SOLN
INTRAVENOUS | Status: AC
  Filled 2015-05-22: qty 1

## 2015-05-22 MED ORDER — PROPOFOL 10 MG/ML IV BOLUS
INTRAVENOUS | Status: DC | PRN
  Administered 2015-05-22: 10 mg via INTRAVENOUS
  Administered 2015-05-22 (×2): 20 mg via INTRAVENOUS
  Administered 2015-05-22: 150 mg via INTRAVENOUS

## 2015-05-22 MED ORDER — SENNA-DOCUSATE SODIUM 8.6-50 MG PO TABS: 2.0000 | ORAL_TABLET | Freq: Every day | ORAL | Status: DC

## 2015-05-22 MED ORDER — EPHEDRINE SULFATE 50 MG/ML IJ SOLN
INTRAMUSCULAR | Status: AC
  Filled 2015-05-22: qty 1

## 2015-05-22 MED ORDER — ROCURONIUM BROMIDE 50 MG/5ML IV SOLN
INTRAVENOUS | Status: AC
  Filled 2015-05-22: qty 1

## 2015-05-22 MED ORDER — HYDROMORPHONE HCL 1 MG/ML IJ SOLN: 0.2500 mg | INTRAMUSCULAR | Status: DC | PRN

## 2015-05-22 MED ORDER — MULTIVITAMINS PO CAPS: 1.0000 | ORAL_CAPSULE | Freq: Every day | ORAL | Status: DC

## 2015-05-22 MED ORDER — MIDAZOLAM HCL 2 MG/2ML IJ SOLN
INTRAMUSCULAR | Status: AC
  Administered 2015-05-22: 2 mg
  Filled 2015-05-22: qty 2

## 2015-05-22 MED ORDER — METOCLOPRAMIDE HCL 5 MG/ML IJ SOLN: 5.0000 mg | Freq: Three times a day (TID) | INTRAMUSCULAR | Status: DC | PRN

## 2015-05-22 MED ORDER — PROPOFOL 10 MG/ML IV BOLUS
INTRAVENOUS | Status: AC
  Filled 2015-05-22: qty 20

## 2015-05-22 MED ORDER — PROMETHAZINE HCL 25 MG/ML IJ SOLN: 6.2500 mg | INTRAMUSCULAR | Status: DC | PRN

## 2015-05-22 MED ORDER — ARTIFICIAL TEARS OP OINT
TOPICAL_OINTMENT | OPHTHALMIC | Status: AC
  Filled 2015-05-22: qty 3.5

## 2015-05-22 MED ORDER — ACETAMINOPHEN 325 MG PO TABS: 650.0000 mg | ORAL_TABLET | Freq: Four times a day (QID) | ORAL | Status: DC | PRN

## 2015-05-22 MED ORDER — POTASSIUM CHLORIDE IN NACL 20-0.45 MEQ/L-% IV SOLN
INTRAVENOUS | Status: DC
  Administered 2015-05-22: 19:00:00 via INTRAVENOUS
  Filled 2015-05-22 (×2): qty 1000

## 2015-05-22 MED ORDER — ENOXAPARIN SODIUM 40 MG/0.4ML ~~LOC~~ SOLN
40.0000 mg | SUBCUTANEOUS | Status: DC
  Administered 2015-05-23: 40 mg via SUBCUTANEOUS
  Filled 2015-05-22: qty 0.4

## 2015-05-22 MED ORDER — DOCUSATE SODIUM 100 MG PO CAPS
100.0000 mg | ORAL_CAPSULE | Freq: Two times a day (BID) | ORAL | Status: DC
  Administered 2015-05-22 – 2015-05-23 (×2): 100 mg via ORAL
  Filled 2015-05-22 (×2): qty 1

## 2015-05-22 MED ORDER — OXYCODONE HCL 10 MG PO TABS: 10.0000 mg | ORAL_TABLET | Freq: Four times a day (QID) | ORAL | Status: DC | PRN

## 2015-05-22 MED ORDER — LACTATED RINGERS IV SOLN
INTRAVENOUS | Status: DC
  Administered 2015-05-22 (×3): via INTRAVENOUS

## 2015-05-22 MED ORDER — FENTANYL CITRATE (PF) 100 MCG/2ML IJ SOLN
INTRAMUSCULAR | Status: DC | PRN
  Administered 2015-05-22 (×2): 50 ug via INTRAVENOUS

## 2015-05-22 MED ORDER — MAGNESIUM CITRATE PO SOLN: 1.0000 | Freq: Once | ORAL | Status: DC | PRN

## 2015-05-22 MED ORDER — BUPIVACAINE-EPINEPHRINE (PF) 0.5% -1:200000 IJ SOLN
INTRAMUSCULAR | Status: DC | PRN
  Administered 2015-05-22: 30 mL via PERINEURAL

## 2015-05-22 MED ORDER — ARTIFICIAL TEARS OP OINT
TOPICAL_OINTMENT | OPHTHALMIC | Status: DC | PRN
  Administered 2015-05-22: 1 via OPHTHALMIC

## 2015-05-22 MED ORDER — SODIUM CHLORIDE 0.9 % IJ SOLN
INTRAMUSCULAR | Status: AC
  Filled 2015-05-22: qty 10

## 2015-05-22 MED ORDER — POLYETHYLENE GLYCOL 3350 17 G PO PACK: 17.0000 g | PACK | Freq: Every day | ORAL | Status: DC | PRN

## 2015-05-22 MED ORDER — TRAMADOL HCL 50 MG PO TABS: 50.0000 mg | ORAL_TABLET | Freq: Two times a day (BID) | ORAL | Status: DC | PRN

## 2015-05-22 MED ORDER — VITAMIN D 1000 UNITS PO TABS
1000.0000 [IU] | ORAL_TABLET | Freq: Every day | ORAL | Status: DC
  Administered 2015-05-23: 1000 [IU] via ORAL
  Filled 2015-05-22: qty 1

## 2015-05-22 MED ORDER — DEXAMETHASONE SODIUM PHOSPHATE 10 MG/ML IJ SOLN
INTRAMUSCULAR | Status: AC
  Filled 2015-05-22: qty 1

## 2015-05-22 MED ORDER — CEFAZOLIN SODIUM 1-5 GM-% IV SOLN
1.0000 g | Freq: Four times a day (QID) | INTRAVENOUS | Status: AC
  Administered 2015-05-22 – 2015-05-23 (×3): 1 g via INTRAVENOUS
  Filled 2015-05-22 (×3): qty 50

## 2015-05-22 MED ORDER — METOCLOPRAMIDE HCL 5 MG PO TABS: 5.0000 mg | ORAL_TABLET | Freq: Three times a day (TID) | ORAL | Status: DC | PRN

## 2015-05-22 MED ORDER — 0.9 % SODIUM CHLORIDE (POUR BTL) OPTIME
TOPICAL | Status: DC | PRN
  Administered 2015-05-22: 1000 mL

## 2015-05-22 MED ORDER — LIDOCAINE HCL (CARDIAC) 20 MG/ML IV SOLN
INTRAVENOUS | Status: DC | PRN
  Administered 2015-05-22: 75 mg via INTRAVENOUS

## 2015-05-22 MED ORDER — GLYCOPYRROLATE 0.2 MG/ML IJ SOLN
INTRAMUSCULAR | Status: AC
  Filled 2015-05-22: qty 1

## 2015-05-22 MED ORDER — DIPHENHYDRAMINE HCL 12.5 MG/5ML PO ELIX: 12.5000 mg | ORAL_SOLUTION | ORAL | Status: DC | PRN

## 2015-05-22 MED ORDER — DEXAMETHASONE SODIUM PHOSPHATE 10 MG/ML IJ SOLN
INTRAMUSCULAR | Status: DC | PRN
  Administered 2015-05-22: 10 mg via INTRAVENOUS

## 2015-05-22 MED ORDER — METHOCARBAMOL 1000 MG/10ML IJ SOLN
500.0000 mg | Freq: Four times a day (QID) | INTRAVENOUS | Status: DC | PRN
  Filled 2015-05-22: qty 5

## 2015-05-22 MED ORDER — KETOROLAC TROMETHAMINE 15 MG/ML IJ SOLN
15.0000 mg | Freq: Four times a day (QID) | INTRAMUSCULAR | Status: DC
  Administered 2015-05-22 – 2015-05-23 (×3): 15 mg via INTRAVENOUS
  Filled 2015-05-22 (×3): qty 1

## 2015-05-22 MED ORDER — ONDANSETRON HCL 4 MG/2ML IJ SOLN
4.0000 mg | Freq: Four times a day (QID) | INTRAMUSCULAR | Status: DC | PRN
Start: 2015-05-22 — End: 2015-05-23

## 2015-05-22 MED ORDER — CALCIUM CARBONATE-VITAMIN D 500-200 MG-UNIT PO TABS
1.0000 | ORAL_TABLET | Freq: Every day | ORAL | Status: DC
  Administered 2015-05-23: 1 via ORAL
  Filled 2015-05-22: qty 1

## 2015-05-22 MED ORDER — SUCCINYLCHOLINE CHLORIDE 20 MG/ML IJ SOLN
INTRAMUSCULAR | Status: AC
  Filled 2015-05-22: qty 1

## 2015-05-22 MED ORDER — LIDOCAINE HCL (CARDIAC) 20 MG/ML IV SOLN
INTRAVENOUS | Status: AC
  Filled 2015-05-22: qty 5

## 2015-05-22 MED ORDER — HYDROMORPHONE HCL 1 MG/ML IJ SOLN
1.0000 mg | INTRAMUSCULAR | Status: DC | PRN
  Administered 2015-05-23: 1 mg via INTRAVENOUS
  Filled 2015-05-22: qty 1

## 2015-05-22 MED ORDER — BACLOFEN 10 MG PO TABS: 10.0000 mg | ORAL_TABLET | Freq: Three times a day (TID) | ORAL | Status: DC

## 2015-05-22 MED ORDER — CALCIUM CARB-CHOLECALCIFEROL 600-200 MG-UNIT PO TABS: ORAL_TABLET | Freq: Every day | ORAL | Status: DC

## 2015-05-22 MED ORDER — ADULT MULTIVITAMIN W/MINERALS CH
1.0000 | ORAL_TABLET | Freq: Every day | ORAL | Status: DC
  Administered 2015-05-23: 1 via ORAL
  Filled 2015-05-22: qty 1

## 2015-05-22 MED ORDER — GLYCOPYRROLATE 0.2 MG/ML IJ SOLN
INTRAMUSCULAR | Status: AC
  Filled 2015-05-22: qty 4

## 2015-05-22 MED ORDER — FENTANYL CITRATE (PF) 250 MCG/5ML IJ SOLN
INTRAMUSCULAR | Status: AC
  Filled 2015-05-22: qty 5

## 2015-05-22 MED ORDER — ONDANSETRON HCL 4 MG PO TABS: 4.0000 mg | ORAL_TABLET | Freq: Three times a day (TID) | ORAL | Status: DC | PRN

## 2015-05-22 MED ORDER — MIDAZOLAM HCL 2 MG/2ML IJ SOLN
INTRAMUSCULAR | Status: AC
  Filled 2015-05-22: qty 4

## 2015-05-22 MED ORDER — EPHEDRINE SULFATE 50 MG/ML IJ SOLN
INTRAMUSCULAR | Status: DC | PRN
Start: 2015-05-22 — End: 2015-05-22
  Administered 2015-05-22: 15 mg via INTRAVENOUS
  Administered 2015-05-22: 10 mg via INTRAVENOUS

## 2015-05-22 MED ORDER — SENNA 8.6 MG PO TABS
1.0000 | ORAL_TABLET | Freq: Two times a day (BID) | ORAL | Status: DC
  Administered 2015-05-22 – 2015-05-23 (×2): 8.6 mg via ORAL
  Filled 2015-05-22 (×2): qty 1

## 2015-05-22 MED ORDER — FENTANYL CITRATE (PF) 100 MCG/2ML IJ SOLN
INTRAMUSCULAR | Status: AC
  Administered 2015-05-22: 100 ug
  Filled 2015-05-22: qty 2

## 2015-05-22 MED ORDER — METHOCARBAMOL 500 MG PO TABS: 500.0000 mg | ORAL_TABLET | Freq: Four times a day (QID) | ORAL | Status: DC | PRN

## 2015-05-22 MED ORDER — ONDANSETRON HCL 4 MG PO TABS: 4.0000 mg | ORAL_TABLET | Freq: Four times a day (QID) | ORAL | Status: DC | PRN

## 2015-05-22 MED ORDER — ONDANSETRON HCL 4 MG/2ML IJ SOLN
INTRAMUSCULAR | Status: AC
  Filled 2015-05-22: qty 2

## 2015-05-22 MED ORDER — SUCCINYLCHOLINE CHLORIDE 20 MG/ML IJ SOLN
INTRAMUSCULAR | Status: DC | PRN
  Administered 2015-05-22: 90 mg via INTRAVENOUS

## 2015-05-22 MED ORDER — ACETAMINOPHEN 650 MG RE SUPP: 650.0000 mg | Freq: Four times a day (QID) | RECTAL | Status: DC | PRN

## 2015-05-22 MED ORDER — OXYCODONE HCL 5 MG PO TABS
5.0000 mg | ORAL_TABLET | ORAL | Status: DC | PRN
  Administered 2015-05-22: 5 mg via ORAL
  Administered 2015-05-23 (×2): 10 mg via ORAL
  Filled 2015-05-22: qty 2
  Filled 2015-05-22: qty 1
  Filled 2015-05-22: qty 2

## 2015-05-22 SURGICAL SUPPLY — 78 items
BANDAGE ELASTIC 4 VELCRO ST LF (GAUZE/BANDAGES/DRESSINGS) ×2 IMPLANT
BANDAGE ELASTIC 6 VELCRO ST LF (GAUZE/BANDAGES/DRESSINGS) ×4 IMPLANT
BANDAGE ESMARK 6X9 LF (GAUZE/BANDAGES/DRESSINGS) IMPLANT
BIT DRILL 2.5X2.75 QC CALB (BIT) ×2 IMPLANT
BNDG CMPR 9X6 STRL LF SNTH (GAUZE/BANDAGES/DRESSINGS) ×1
BNDG ESMARK 6X9 LF (GAUZE/BANDAGES/DRESSINGS) ×3
BONE CANC CHIPS 20CC PCAN1/4 (Bone Implant) ×3 IMPLANT
BOOTCOVER CLEANROOM LRG (PROTECTIVE WEAR) ×2 IMPLANT
CHIPS CANC BONE 20CC PCAN1/4 (Bone Implant) ×1 IMPLANT
COVER SURGICAL LIGHT HANDLE (MISCELLANEOUS) ×3 IMPLANT
CUFF TOURNIQUET SINGLE 34IN LL (TOURNIQUET CUFF) ×2 IMPLANT
CUFF TOURNIQUET SINGLE 44IN (TOURNIQUET CUFF) IMPLANT
DECANTER SPIKE VIAL GLASS SM (MISCELLANEOUS) IMPLANT
DRAPE C-ARM 42X72 X-RAY (DRAPES) ×2 IMPLANT
DRAPE C-ARMOR (DRAPES) ×2 IMPLANT
DRAPE U-SHAPE 47X51 STRL (DRAPES) ×2 IMPLANT
DRSG PAD ABDOMINAL 8X10 ST (GAUZE/BANDAGES/DRESSINGS) ×3 IMPLANT
DURAPREP 26ML APPLICATOR (WOUND CARE) ×3 IMPLANT
ELECT REM PT RETURN 9FT ADLT (ELECTROSURGICAL) ×3
ELECTRODE REM PT RTRN 9FT ADLT (ELECTROSURGICAL) ×1 IMPLANT
GAUZE SPONGE 4X4 12PLY STRL (GAUZE/BANDAGES/DRESSINGS) ×3 IMPLANT
GAUZE XEROFORM 1X8 LF (GAUZE/BANDAGES/DRESSINGS) ×4 IMPLANT
GLOVE BIO SURGEON STRL SZ 6.5 (GLOVE) ×1 IMPLANT
GLOVE BIO SURGEONS STRL SZ 6.5 (GLOVE) ×1
GLOVE BIOGEL M 7.0 STRL (GLOVE) ×2 IMPLANT
GLOVE BIOGEL PI IND STRL 7.0 (GLOVE) IMPLANT
GLOVE BIOGEL PI IND STRL 8 (GLOVE) IMPLANT
GLOVE BIOGEL PI INDICATOR 7.0 (GLOVE) ×4
GLOVE BIOGEL PI INDICATOR 8 (GLOVE) ×2
GLOVE BIOGEL PI ORTHO PRO SZ8 (GLOVE) ×2
GLOVE ORTHO TXT STRL SZ7.5 (GLOVE) ×3 IMPLANT
GLOVE PI ORTHO PRO STRL SZ8 (GLOVE) ×1 IMPLANT
GLOVE SURG ORTHO 8.0 STRL STRW (GLOVE) ×6 IMPLANT
GLOVE SURG SS PI 8.0 STRL IVOR (GLOVE) ×4 IMPLANT
GOWN STRL REUS W/ TWL LRG LVL3 (GOWN DISPOSABLE) IMPLANT
GOWN STRL REUS W/TWL LRG LVL3 (GOWN DISPOSABLE)
GRAFT BNE CANC CHIPS 1-8 20CC (Bone Implant) IMPLANT
K-WIRE ACE 1.6X6 (WIRE) ×12
K-WIRE FIXATION 2.0X6 (WIRE) ×3
KIT BASIN OR (CUSTOM PROCEDURE TRAY) ×3 IMPLANT
KIT ROOM TURNOVER OR (KITS) ×3 IMPLANT
KWIRE ACE 1.6X6 (WIRE) IMPLANT
KWIRE FIXATION 2.0X6 (WIRE) IMPLANT
MANIFOLD NEPTUNE II (INSTRUMENTS) ×3 IMPLANT
NS IRRIG 1000ML POUR BTL (IV SOLUTION) ×3 IMPLANT
PACK ORTHO EXTREMITY (CUSTOM PROCEDURE TRAY) ×3 IMPLANT
PAD ABD 8X10 STRL (GAUZE/BANDAGES/DRESSINGS) ×2 IMPLANT
PAD ARMBOARD 7.5X6 YLW CONV (MISCELLANEOUS) ×6 IMPLANT
PAD CAST 4YDX4 CTTN HI CHSV (CAST SUPPLIES) ×2 IMPLANT
PADDING CAST COTTON 4X4 STRL (CAST SUPPLIES) ×6
PADDING CAST COTTON 6X4 STRL (CAST SUPPLIES) ×2 IMPLANT
PIN 4.5MM CANC THREADED AO (PIN) ×2 IMPLANT
PLATE LOCK SM LT CALC FOOT (Plate) ×2 IMPLANT
SCREW CORT 3.5X32 (Screw) ×6 IMPLANT
SCREW CORT 3.5X40 (Screw) ×2 IMPLANT
SCREW CORT T15 28X3.5XST LCK (Screw) IMPLANT
SCREW CORT T15 30X3.5XST LCK (Screw) IMPLANT
SCREW CORT T15 32X3.5XST LCK (Screw) IMPLANT
SCREW CORTICAL 3.5X28MM (Screw) ×3 IMPLANT
SCREW CORTICAL 3.5X30MM (Screw) ×6 IMPLANT
SCREW LP 3.5X38MM (Screw) ×4 IMPLANT
SCREW NL LP 36X3.5 (Screw) ×2 IMPLANT
SPLINT PLASTER CAST XFAST 5X30 (CAST SUPPLIES) IMPLANT
SPLINT PLASTER XFAST SET 5X30 (CAST SUPPLIES) ×2
SPONGE GAUZE 4X4 12PLY STER LF (GAUZE/BANDAGES/DRESSINGS) ×2 IMPLANT
SPONGE LAP 4X18 X RAY DECT (DISPOSABLE) ×4 IMPLANT
SUCTION FRAZIER TIP 10 FR DISP (SUCTIONS) ×3 IMPLANT
SUT ETHILON 3 0 PS 1 (SUTURE) ×4 IMPLANT
SUT VIC AB 0 CTB1 27 (SUTURE) ×6 IMPLANT
SUT VIC AB 2-0 CTB1 (SUTURE) ×6 IMPLANT
SYR CONTROL 10ML LL (SYRINGE) IMPLANT
TOWEL OR 17X24 6PK STRL BLUE (TOWEL DISPOSABLE) ×3 IMPLANT
TOWEL OR 17X26 10 PK STRL BLUE (TOWEL DISPOSABLE) ×3 IMPLANT
TUBE CONNECTING 12'X1/4 (SUCTIONS) ×1
TUBE CONNECTING 12X1/4 (SUCTIONS) ×2 IMPLANT
UNDERPAD 30X30 INCONTINENT (UNDERPADS AND DIAPERS) ×3 IMPLANT
WATER STERILE IRR 1000ML POUR (IV SOLUTION) ×3 IMPLANT
YANKAUER SUCT BULB TIP NO VENT (SUCTIONS) ×2 IMPLANT

## 2015-05-22 NOTE — H&P (Signed)
PREOPERATIVE H&P  Chief Complaint: left CALCANEAL FRACTURE  HPI: Traci Hoffman is a 54 y.o. female who presents for preoperative history and physical with a diagnosis of CALCANEAL FRACTURE. Symptoms are rated as moderate to severe, and have been worsening.  This is significantly impairing activities of daily living.  She has elected for surgical management. This occurred 05/12/2015, when she stepped off a curb in [place]. She had acute onset 10/10 pain unable to walk. She flew back home, and has been using Tylenol to try and avoid narcotics because of history of dependency. She normally is a runner, and has pain directly over the left heel.   Past Medical History  Diagnosis Date  . Narcotic abuse     --Percocet and Vicodin abuse  . Elevated blood sugar    Past Surgical History  Procedure Laterality Date  . Tubal ligation    . Shoulder arthroscopy Right 08/2009  . Rotator cuff repair Left   . Mandible surgery    . Gynecologic cryosurgery  1987    CIN I  . Ganglion cyst excision      LEFT  . Knee arthroscopy      1980   Social History   Social History  . Marital Status: Married    Spouse Name: N/A  . Number of Children: N/A  . Years of Education: N/A   Social History Main Topics  . Smoking status: Never Smoker   . Smokeless tobacco: Never Used  . Alcohol Use: No  . Drug Use: No  . Sexual Activity:    Partners: Male    Birth Control/ Protection: Post-menopausal, Surgical     Comment: Tubal   Other Topics Concern  . Not on file   Social History Narrative   Family History  Problem Relation Age of Onset  . Adopted: Yes   No Known Allergies Prior to Admission medications   Medication Sig Start Date End Date Taking? Authorizing Provider  acetaminophen (TYLENOL) 500 MG tablet Take 1,000 mg by mouth every 8 (eight) hours as needed for mild pain or moderate pain.   Yes Historical Provider, MD  Calcium Carb-Cholecalciferol (CALCIUM 600 + D PO) Take 1 tablet by mouth  daily.   Yes Historical Provider, MD  cholecalciferol (VITAMIN D) 1000 UNITS tablet Take 1,000 Units by mouth daily.   Yes Historical Provider, MD  Multiple Vitamin (MULTIVITAMIN) capsule Take 1 capsule by mouth daily.   Yes Historical Provider, MD  traMADol (ULTRAM) 50 MG tablet Take 50 mg by mouth every 12 (twelve) hours as needed.   Yes Historical Provider, MD     Positive ROS: All other systems have been reviewed and were otherwise negative with the exception of those mentioned in the HPI and as above.  Physical Exam: General: Alert, no acute distress Cardiovascular: No pedal edema Respiratory: No cyanosis, no use of accessory musculature GI: No organomegaly, abdomen is soft and non-tender Skin: No lesions in the area of chief complaint Neurologic: Sensation intact distally Psychiatric: Patient is competent for consent with normal mood and affect Lymphatic: No axillary or cervical lymphadenopathy  MUSCULOSKELETAL: Left foot has sensation intact throughout, good capillary refill, positive pain to palpation over the calcaneus, acceptable soft tissue swelling.  Assessment: Left CALCANEAL FRACTURE With displacement, comminution  Plan: Plan for Procedure(s): OPEN REDUCTION INTERNAL FIXATION (ORIF) CALCANEOUS FRACTURE  The risks benefits and alternatives were discussed with the patient including but not limited to the risks of nonoperative treatment, versus surgical intervention including infection, bleeding, nerve injury,  malunion, nonunion, the need for revision surgery, hardware prominence, hardware failure, the need for hardware removal, blood clots, cardiopulmonary complications, morbidity, mortality, among others, and they were willing to proceed.   We also discussed the risks for posttraumatic arthrosis, persistent hindfoot stiffness, difficulties with shoe wear, among others and hopefully we will be able to optimize these through surgical intervention.  Eulas Post, MD Cell  [phone]   05/22/2015 9:26 AM

## 2015-05-22 NOTE — Anesthesia Postprocedure Evaluation (Signed)
  Anesthesia Post-op Note  Patient: Traci Hoffman  Procedure(s) Performed: Procedure(s): OPEN REDUCTION INTERNAL FIXATION (ORIF) CALCANEOUS FRACTURE (Left)  Patient Location: PACU  Anesthesia Type:GA combined with regional for post-op pain  Level of Consciousness: awake and alert   Airway and Oxygen Therapy: Patient Spontanous Breathing  Post-op Pain: none  Post-op Assessment: Post-op Vital signs reviewed LLE Motor Response: No movement due to regional block LLE Sensation: Numbness (had block)          Post-op Vital Signs: stable  Last Vitals:  Filed Vitals:   05/22/15 1421  BP:   Pulse: 99  Temp:   Resp: 16    Complications: No apparent anesthesia complications

## 2015-05-22 NOTE — Anesthesia Preprocedure Evaluation (Signed)
Anesthesia Evaluation  Patient identified by MRN, date of birth, ID band Patient awake    Reviewed: Allergy & Precautions, H&P , NPO status , Patient's Chart, lab work & pertinent test results  History of Anesthesia Complications Negative for: history of anesthetic complications  Airway Mallampati: I       Dental no notable dental hx. (+) Teeth Intact   Pulmonary neg pulmonary ROS,  breath sounds clear to auscultation  Pulmonary exam normal       Cardiovascular negative cardio ROS  IRhythm:Regular Rate:Normal     Neuro/Psych negative neurological ROS  negative psych ROS   GI/Hepatic negative GI ROS, Neg liver ROS,   Endo/Other  negative endocrine ROS  Renal/GU negative Renal ROS  negative genitourinary   Musculoskeletal   Abdominal   Peds  Hematology negative hematology ROS (+)   Anesthesia Other Findings   Reproductive/Obstetrics negative OB ROS                             Anesthesia Physical Anesthesia Plan  ASA: I  Anesthesia Plan: General   Post-op Pain Management: GA combined w/ Regional for post-op pain   Induction: Intravenous  Airway Management Planned: LMA  Additional Equipment:   Intra-op Plan:   Post-operative Plan:   Informed Consent: I have reviewed the patients History and Physical, chart, labs and discussed the procedure including the risks, benefits and alternatives for the proposed anesthesia with the patient or authorized representative who has indicated his/her understanding and acceptance.     Plan Discussed with: CRNA and Surgeon  Anesthesia Plan Comments:         Anesthesia Quick Evaluation

## 2015-05-22 NOTE — Transfer of Care (Signed)
Immediate Anesthesia Transfer of Care Note  Patient: Traci Hoffman  Procedure(s) Performed: Procedure(s): OPEN REDUCTION INTERNAL FIXATION (ORIF) CALCANEOUS FRACTURE (Left)  Patient Location: PACU  Anesthesia Type:General and Regional  Level of Consciousness: awake, alert , oriented and sedated  Airway & Oxygen Therapy: Patient Spontanous Breathing and Patient connected to nasal cannula oxygen  Post-op Assessment: Report given to RN, Post -op Vital signs reviewed and stable and Patient moving all extremities  Post vital signs: Reviewed and stable  Last Vitals:  Filed Vitals:   05/22/15 0959  BP: 124/53  Pulse: 102  Temp:   Resp: 24    Complications: No apparent anesthesia complications

## 2015-05-22 NOTE — Discharge Instructions (Signed)
Diet: As you were doing prior to hospitalization   Shower:  May shower but keep the wounds dry, use an occlusive plastic wrap, NO SOAKING IN TUB.  If the bandage gets wet, change with a clean dry gauze.  If you have a splint on, leave the splint in place and keep the splint dry with a plastic bag.  Dressing:  You may change your dressing 3-5 days after surgery, unless you have a splint.  If you have a splint, then just leave the splint in place and we will change your bandages during your first follow-up appointment.    If you had hand or foot surgery, we will plan to remove your stitches in about 2 weeks in the office.  For all other surgeries, there are sticky tapes (steri-strips) on your wounds and all the stitches are absorbable.  Leave the steri-strips in place when changing your dressings, they will peel off with time, usually 2-3 weeks.  Activity:  Increase activity slowly as tolerated, but follow the weight bearing instructions below.  The rules on driving is that you can not be taking narcotics while you drive, and you must feel in control of the vehicle.    Weight Bearing:   No weight on left foot.    To prevent constipation: you may use a stool softener such as -  Colace (over the counter) 100 mg by mouth twice a day  Drink plenty of fluids (prune juice may be helpful) and high fiber foods Miralax (over the counter) for constipation as needed.    Itching:  If you experience itching with your medications, try taking only a single pain pill, or even half a pain pill at a time.  You may take up to 10 pain pills per day, and you can also use benadryl over the counter for itching or also to help with sleep.   Precautions:  If you experience chest pain or shortness of breath - call 911 immediately for transfer to the hospital emergency department!!  If you develop a fever greater that 101 F, purulent drainage from wound, increased redness or drainage from wound, or calf pain -- Call the  office at [phone]                                                Follow- Up Appointment:  Please call for an appointment to be seen in 2 weeks [place] - [phone]

## 2015-05-22 NOTE — Op Note (Signed)
05/22/2015  1:29 PM  PATIENT:  [name]    PRE-OPERATIVE DIAGNOSIS:  LEFT CALCANEUS FRACTURE  POST-OPERATIVE DIAGNOSIS:  Same  PROCEDURE:  OPEN REDUCTION INTERNAL FIXATION (ORIF) CALCANEOUS FRACTURE  SURGEON:  Eulas Post, MD  PHYSICIAN ASSISTANT: Janace Litten, OPA-C, present and scrubbed throughout the case, critical for completion in a timely fashion, and for retraction, instrumentation, and closure.  ANESTHESIA:   General  PREOPERATIVE INDICATIONS:  Traci Hoffman is a  54 y.o. female with a diagnosis of LEFT CALCANEAL FRACTURE who had significant displacement and elected for surgical management.    The risks benefits and alternatives were discussed with the patient preoperatively including but not limited to the risks of infection, bleeding, nerve injury, cardiopulmonary complications, the need for revision surgery, among others, and the patient was willing to proceed. We also discussed the risks for posttraumatic arthritis, subtalar loss of motion, tendon rupture, wound complications, the need for future arthrodesis, among others.  OPERATIVE IMPLANTS: Biomet titanium calcaneal plate with multiple cortical bicortical screws.  OPERATIVE FINDINGS: There was significant shortening and comminution of the subtalar joint. There was also some bone loss with impaction. The lateral wall was actually still attached, but there was substantial medial blowout. There was extreme comminution within the subtalar joint itself.  OPERATIVE PROCEDURE: The patient is brought to the operating room and placed in the supine position. Gen. anesthesia was administered. She was turned into a semilateral decubitus position. She also had a block. Time out was performed. The left leg was elevated and exsanguinated and a tourniquet was inflated. Lateral incision was made and a full-thickness flap elevated. The K wires was used to reflect the flap laterally, and retracted. I exposed all the way up to the  peroneal tubercle and reflected the tendons anteriorly. I exposed the subtalar joint, and then remove the lateral wall with an osteotome. There was significant comminution within the subtalar joint itself. I placed a Schanz pin in the calcaneal tuberosity, elongated the position of the calcaneus, slid the joker through the fracture site to help correct the tuberosity position with relationship to the medial sustentacular segment. Once I was satisfied with this correction I used a total of 2 K wires from the tuberosity to maintain the position.  This is confirmed on C-arm using AP, oblique, and Harris heel views.  I then reapplied the lateral wall after confirming that my angle of Guissane had been corrected, and the height and the varus position had been corrected.  The subtalar joint appeared congruent, and I placed an appropriate sized plate which was the smallest plate available. This was secured to the wall, and then cortical screws placed. I had excellent bicortical purchase on the vast majority of screws. The subtalar joint appeared congruent, as viewed on the oblique view as well as the lateral view. There was no hardware penetrating either the distal or superior aspect of the calcaneus and the lengths of the screws were confirmed on Harris heel view.  Complete fixation was achieved, the wounds were irrigated copiously, the tourniquet released at 2 hours, and the flap repaired with nylon. Sterile gauze was applied, and she was awakened and returned to the PACU in stable and satisfactory condition after a splint was applied. She tolerated the procedure well and there were no complications.

## 2015-05-22 NOTE — Anesthesia Procedure Notes (Addendum)
Anesthesia Regional Block:  Popliteal block  Pre-Anesthetic Checklist: ,, timeout performed, Correct Patient, Correct Site, Correct Laterality, Correct Procedure, Correct Position, site marked, Risks and benefits discussed,  Surgical consent,  Pre-op evaluation,  At surgeon's request and post-op pain management  Laterality: Lower and Left  Prep: chloraprep       Needles:  Injection technique: Single-shot  Needle Type: Stimulator Needle - 80     Needle Length: 9cm 9 cm Needle Gauge: 22 and 22 G  Needle insertion depth: 6 cm   Additional Needles:  Procedures: ultrasound guided (picture in chart) and nerve stimulator Popliteal block  Nerve Stimulator or Paresthesia:  Response: Twitch elicited, 0.8 mA, 0.3 ms, 6 cm  Additional Responses:   Narrative:  Start time: 05/22/2015 9:35 AM End time: 05/22/2015 9:50 AM Injection made incrementally with aspirations every 5 mL.  Performed by: Personally  Anesthesiologist: MASSAGEE, TERRY  Additional Notes: This is a lateral approach to the popliteal fossa area. A stimulator needle is used starting at 1.5 mAmp current and descending as nerve contact is made. Injection of anesthetic is in 5 ml increments with multiple neg aspirations before continuing. Total volume is 40 cc.     Procedure Name: Intubation Date/Time: 05/22/2015 10:44 AM Performed by: Fransisca Kaufmann Pre-anesthesia Checklist: Patient identified, Emergency Drugs available, Suction available, Patient being monitored and Timeout performed Patient Re-evaluated:Patient Re-evaluated prior to inductionOxygen Delivery Method: Circle system utilized Preoxygenation: Pre-oxygenation with 100% oxygen Intubation Type: IV induction Ventilation: Mask ventilation without difficulty Laryngoscope Size: Miller and 2 Grade View: Grade I Tube type: Oral Tube size: 7.0 mm Number of attempts: 1 Airway Equipment and Method: Stylet Placement Confirmation: ETT inserted through vocal cords  under direct vision,  breath sounds checked- equal and bilateral and positive ETCO2 Secured at: 22 cm Tube secured with: Tape Dental Injury: Teeth and Oropharynx as per pre-operative assessment

## 2015-05-23 ENCOUNTER — Encounter (HOSPITAL_COMMUNITY): Payer: Self-pay | Admitting: Orthopedic Surgery

## 2015-05-23 DIAGNOSIS — S92002A Unspecified fracture of left calcaneus, initial encounter for closed fracture: Secondary | ICD-10-CM | POA: Diagnosis not present

## 2015-05-23 NOTE — Progress Notes (Signed)
OT Cancellation Note  Patient Details Name: Traci Hoffman MRN: [id number] DOB: Jan 13, 1961   Cancelled Treatment:    Reason Eval/Treat Not Completed: OT screened, no needs identified, will sign off.  Pt has been managing ADL while maintaining NWB status x 1 week prior to admission.  She has obtained a shower seat for home.   Evern Bio 05/23/2015, 9:55 AM  [phone]

## 2015-05-23 NOTE — Evaluation (Signed)
Physical Therapy Evaluation Patient Details Name: Traci Hoffman MRN: [id number] DOB: 1960-10-08 Today's Date: 05/23/2015   History of Present Illness  Patient is a 54 y/o female s/p left ORIF calcaneal fx. PMH includes narcotic abuse.  Clinical Impression  Patient presents with NWB LLE s/p above surgery impacting safe mobility. Performed gait training and stair training this session. Pt has been using crutches and knee walker for mobility for ~ 1 week since injury. Has been crawling up steps at home due to fear of using crutches. Performed stair training with crutches and family present. Pt more comfortable now. Pt has support at home from family. All education completed. Does not require further skilled therapy services. Discharge from therapy.    Follow Up Recommendations No PT follow up;Supervision for mobility/OOB    Equipment Recommendations  None recommended by PT    Recommendations for Other Services       Precautions / Restrictions Precautions Precautions: Fall Restrictions Weight Bearing Restrictions: Yes LLE Weight Bearing: Non weight bearing      Mobility  Bed Mobility Overal bed mobility: Modified Independent                Transfers Overall transfer level: Needs assistance Equipment used: Crutches Transfers: Sit to/from Stand Sit to Stand: Supervision         General transfer comment: Supervision for safety. Cues to use crutches correctly/safety prior to standing and to sit.   Ambulation/Gait Ambulation/Gait assistance: Supervision Ambulation Distance (Feet): 25 Feet Assistive device: Crutches Gait Pattern/deviations: Step-to pattern   Gait velocity interpretation: <1.8 ft/sec, indicative of risk for recurrent falls General Gait Details: "Hop to" gait pattern with crutches. Compliant with NWB LLE.  Stairs Stairs: Yes Stairs assistance: Min guard Stair Management: Step to pattern;Forwards;With crutches;One rail Right Number of Stairs: 3 (+  2 steps x2 bouts) General stair comments: Cues for sequencing using crutches and rail for support. Pt attempted to use Bil crutches to descend and pt with LOB requiring Min A to prevent fall. Spouse present for session.   Wheelchair Mobility    Modified Rankin (Stroke Patients Only)       Balance Overall balance assessment: Needs assistance Sitting-balance support: Feet supported;No upper extremity supported Sitting balance-Leahy Scale: Good     Standing balance support: During functional activity Standing balance-Leahy Scale: Fair                               Pertinent Vitals/Pain Pain Assessment: 0-10 Pain Score: 3  Pain Location: left ankle Pain Descriptors / Indicators: Sore;Aching;Throbbing Pain Intervention(s): Monitored during session;Repositioned;Limited activity within patient's tolerance    Home Living Family/patient expects to be discharged to:: Private residence Living Arrangements: Spouse/significant other Available Help at Discharge: Family;Available PRN/intermittently Type of Home: House Home Access: Stairs to enter Entrance Stairs-Rails: Right Entrance Stairs-Number of Steps: 3 Home Layout: Two level;Bed/bath upstairs Home Equipment: Crutches;Other (comment) (knee walker)      Prior Function Level of Independence: Independent with assistive device(s)         Comments: Pt has been using crutches for 1 week prior due to injury and using crutches and knee walker.     Hand Dominance        Extremity/Trunk Assessment   Upper Extremity Assessment: Defer to OT evaluation           Lower Extremity Assessment: RLE deficits/detail RLE Deficits / Details: Able to wiggle toes and perform LAQ, SLR.  Communication   Communication: No difficulties  Cognition Arousal/Alertness: Awake/alert Behavior During Therapy: WFL for tasks assessed/performed Overall Cognitive Status: Within Functional Limits for tasks assessed                       General Comments General comments (skin integrity, edema, etc.): Education re: edema management, mobility, exercises.    Exercises        Assessment/Plan    PT Assessment Patent does not need any further PT services  PT Diagnosis Difficulty walking;Acute pain   PT Problem List    PT Treatment Interventions     PT Goals (Current goals can be found in the Care Plan section) Acute Rehab PT Goals Patient Stated Goal: to go home today PT Goal Formulation: All assessment and education complete, DC therapy Time For Goal Achievement: 06/06/15 Potential to Achieve Goals: Good    Frequency     Barriers to discharge        Co-evaluation               End of Session Equipment Utilized During Treatment: Gait belt Activity Tolerance: Patient tolerated treatment well Patient left: in chair;with family/visitor present;with call bell/phone within reach Nurse Communication: Mobility status    Functional Assessment Tool Used: Clinical judgment Functional Limitation: Mobility: Walking and moving around Mobility: Walking and Moving Around Current Status [phone]): At least 20 percent but less than 40 percent impaired, limited or restricted Mobility: Walking and Moving Around Goal Status [phone]): At least 1 percent but less than 20 percent impaired, limited or restricted Mobility: Walking and Moving Around Discharge Status [phone]): At least 1 percent but less than 20 percent impaired, limited or restricted    Time: 0916-0939 PT Time Calculation (min) (ACUTE ONLY): 23 min   Charges:   PT Evaluation $Initial PT Evaluation Tier I: 1 Procedure PT Treatments $Gait Training: 8-22 mins   PT G Codes:   PT G-Codes **NOT FOR INPATIENT CLASS** Functional Assessment Tool Used: Clinical judgment Functional Limitation: Mobility: Walking and moving around Mobility: Walking and Moving Around Current Status (L2440): At least 20 percent but less than 40 percent impaired, limited  or restricted Mobility: Walking and Moving Around Goal Status [phone]): At least 1 percent but less than 20 percent impaired, limited or restricted Mobility: Walking and Moving Around Discharge Status [phone]): At least 1 percent but less than 20 percent impaired, limited or restricted    Ethelene Closser A Pamelia Botto 05/23/2015, 11:49 AM Mylo Red, PT, DPT [phone]

## 2015-05-23 NOTE — Progress Notes (Signed)
Patient being discharged home with family, summary reviewed and prescriptions given. IV removed follow up appointment was made by patient, pain controled, will take patient down in wheel chair.

## 2015-05-23 NOTE — Discharge Summary (Signed)
Physician Discharge Summary  Patient ID: Traci Hoffman MRN: [id number] DOB/AGE: [birth year] 53 y.o.  Admit date: 05/22/2015 Discharge date: 05/23/2015  Admission Diagnoses:  Calcaneus fracture, left  Discharge Diagnoses:  Principal Problem:   Calcaneus fracture, left   Past Medical History  Diagnosis Date  . Narcotic abuse     --Percocet and Vicodin abuse  . Elevated blood sugar     Surgeries: Procedure(s): OPEN REDUCTION INTERNAL FIXATION (ORIF) CALCANEOUS FRACTURE on 05/22/2015   Consultants (if any):    Discharged Condition: Improved  Hospital Course: Traci Hoffman is an 54 y.o. female who was admitted 05/22/2015 with a diagnosis of Calcaneus fracture, left and went to the operating room on 05/22/2015 and underwent the above named procedures.    She was given perioperative antibiotics:  Anti-infectives    Start     Dose/Rate Route Frequency Ordered Stop   05/22/15 1800  ceFAZolin (ANCEF) IVPB 1 g/50 mL premix     1 g 100 mL/hr over 30 Minutes Intravenous Every 6 hours 05/22/15 1605 05/23/15 0621   05/22/15 1015  ceFAZolin (ANCEF) IVPB 2 g/50 mL premix     2 g 100 mL/hr over 30 Minutes Intravenous To ShortStay Surgical 05/21/15 1232 05/22/15 1046    .  She was given sequential compression devices, early ambulation,  for DVT prophylaxis.  She still had some diffuse numbness in the foot from the block the am after surgery.    Post op CT demonstrates excellent alignment.  She benefited maximally from the hospital stay and there were no complications.    Recent vital signs:  Filed Vitals:   05/23/15 0454  BP: 111/55  Pulse: 75  Temp: 98 F (36.7 C)  Resp: 16    Recent laboratory studies:  Lab Results  Component Value Date   HGB 11.8* 05/22/2015   HGB 12.2 05/18/2015   HGB 12.7 12/06/2014   Lab Results  Component Value Date   WBC 7.8 05/22/2015   PLT 353 05/22/2015   No results found for: INR Lab Results  Component Value Date   NA 140 05/18/2015    K 4.1 05/18/2015   CL 103 05/18/2015   CO2 28 05/18/2015   BUN 11 05/18/2015   CREATININE 0.72 05/22/2015   GLUCOSE 109* 05/18/2015    Discharge Medications:     Medication List    TAKE these medications        acetaminophen 500 MG tablet  Commonly known as:  TYLENOL  Take 1,000 mg by mouth every 8 (eight) hours as needed for mild pain or moderate pain.     baclofen 10 MG tablet  Commonly known as:  LIORESAL  Take 1 tablet (10 mg total) by mouth 3 (three) times daily. As needed for muscle spasm     CALCIUM 600 + D PO  Take 1 tablet by mouth daily.     cholecalciferol 1000 UNITS tablet  Commonly known as:  VITAMIN D  Take 1,000 Units by mouth daily.     multivitamin capsule  Take 1 capsule by mouth daily.     ondansetron 4 MG tablet  Commonly known as:  ZOFRAN  Take 1 tablet (4 mg total) by mouth every 8 (eight) hours as needed for nausea or vomiting.     Oxycodone HCl 10 MG Tabs  Take 1-2 tablets (10-20 mg total) by mouth 4 (four) times daily as needed (severe pain).     sennosides-docusate sodium 8.6-50 MG tablet  Commonly known as:  SENOKOT-S  Take 2 tablets by mouth daily.     traMADol 50 MG tablet  Commonly known as:  ULTRAM  Take 1 tablet (50 mg total) by mouth every 12 (twelve) hours as needed.        Diagnostic Studies: Dg Os Calcis Left  05/22/2015   CLINICAL DATA:  Patient status post ORIF left calcaneus fracture.  EXAM: DG C-ARM 61-120 MIN; LEFT OS CALCIS - 2+ VIEW  COMPARISON:  CT 05/15/2025  FINDINGS: 5 intraoperative fluoroscopic images were presented. Patient status post ORIF plate and screw fixation of impacted comminuted calcaneal fracture. Calcaneus appears in improved anatomic alignment. Overlying soft tissue swelling.  IMPRESSION: Patient status post ORIF comminuted calcaneus fracture.   Electronically Signed   By: Annia Belt M.D.   On: 05/22/2015 13:35   Ct Foot Left Wo Contrast  05/22/2015   CLINICAL DATA:  Status post ORIF of a  comminuted calcaneal fracture.  EXAM: CT OF THE LEFT FOOT WITHOUT CONTRAST  TECHNIQUE: Multidetector CT imaging of the left foot was performed according to the standard protocol. Multiplanar CT image reconstructions were also generated.  COMPARISON:  CT, 05/16/2015.  FINDINGS: Since prior CT, fixation plates and screws have been placed to reduce the comminuted calcaneal fractures. Fracture is not intra-articular fracture with fracture lines intersecting the posterior facet of the subtalar joint. The displaced fractures have been reduced with minimal residual displacement. There is mild incongruent E of the posterior facet of the subtalar joint with approximately 2.4 mm of residual offset between articular fracture component. The orthopedic hardware is well-seated and aligned.  Ankle tendons are grossly intact. There is some soft tissue air along the ankle joint and adjacent to the calcaneus as well as more diffuse soft tissue edema.  IMPRESSION: 1. S/p ORIF of the comminuted calcaneal fractures. Fracture fragments have been successfully reduced with only minimal residual displacement and mild residual incongruity of the posterior facet of the subtalar joint. Orthopedic hardware appears well seated and well aligned. No evidence of an operative complication.   Electronically Signed   By: Amie Portland M.D.   On: 05/22/2015 21:18   Ct Foot Left Wo Contrast  05/16/2015   CLINICAL DATA:  Calcaneus fracture. Patient missed a step in the dark on 05/11/2015.  EXAM: CT OF THE LEFT FOOT WITHOUT CONTRAST  TECHNIQUE: Multidetector CT imaging of the left foot was performed according to the standard protocol. Multiplanar CT image reconstructions were also generated.  COMPARISON:  None.  FINDINGS: There is a and severely comminuted fracture of the calcaneus. There is a sagittal fracture through the posterior facet with compression of medial component. There is a fracture through the base of the sustentaculum tali as well as  through the base of the anterior process. The fracture extends through the distal articular surface with the cuboid. The fracture through the main body of the calcaneus is distracted with plantar displacement of a large fragment. No other fractures.  IMPRESSION: Impacted comminuted fracture of the calcaneus as described.   Electronically Signed   By: Francene Boyers M.D.   On: 05/16/2015 17:48   Dg C-arm 61-120 Min  05/22/2015   CLINICAL DATA:  Patient status post ORIF left calcaneus fracture.  EXAM: DG C-ARM 61-120 MIN; LEFT OS CALCIS - 2+ VIEW  COMPARISON:  CT 05/15/2025  FINDINGS: 5 intraoperative fluoroscopic images were presented. Patient status post ORIF plate and screw fixation of impacted comminuted calcaneal fracture. Calcaneus appears in improved anatomic alignment. Overlying soft tissue swelling.  IMPRESSION: Patient  status post ORIF comminuted calcaneus fracture.   Electronically Signed   By: Annia Belt M.D.   On: 05/22/2015 13:35    Disposition:       Discharge Instructions    Non weight bearing    Complete by:  As directed            Follow-up Information    Follow up with Eulas Post, MD. Schedule an appointment as soon as possible for a visit in 1 week.   Specialty:  Orthopedic Surgery   Contact information:   [address] [place] [address] [phone]        Signed: Eulas Post 05/23/2015, 8:45 AM

## 2015-08-20 ENCOUNTER — Telehealth: Payer: Self-pay

## 2015-08-20 NOTE — Telephone Encounter (Signed)
Spoke with patient regarding [facility]osis [facility].  She states she saw Dr. Cleophas DunkerBassett, who had ordered the test, last week and began Fosamax.  She thanked me for calling.  See Epic for results of BMD.

## 2015-09-19 ENCOUNTER — Other Ambulatory Visit: Payer: Self-pay

## 2015-09-19 DIAGNOSIS — Z1231 Encounter for screening mammogram for malignant neoplasm of breast: Secondary | ICD-10-CM

## 2015-09-30 DIAGNOSIS — N870 Mild cervical dysplasia: Secondary | ICD-10-CM

## 2015-09-30 HISTORY — DX: Mild cervical dysplasia: N87.0

## 2015-10-31 ENCOUNTER — Ambulatory Visit
Admission: RE | Admit: 2015-10-31 | Discharge: 2015-10-31 | Disposition: A | Discharge status: Home / Self Care | Payer: 59 | Source: Ambulatory Visit

## 2015-10-31 DIAGNOSIS — Z1231 Encounter for screening mammogram for malignant neoplasm of breast: Secondary | ICD-10-CM

## 2015-11-23 ENCOUNTER — Telehealth: Payer: Self-pay | Admitting: Nurse Practitioner

## 2015-11-23 NOTE — Telephone Encounter (Signed)
Routing to Patricia Grubb, FNP as FYI. Will close encounter. 

## 2015-11-23 NOTE — Telephone Encounter (Signed)
Patient left a message on our voice mail that she received a letter in the mail saying it's time to schedule her AEX. Patient says she's going to see her family practitioner instead.

## 2015-12-19 ENCOUNTER — Ambulatory Visit: Payer: Managed Care, Other (non HMO) | Admitting: Obstetrics and Gynecology

## 2016-07-30 ENCOUNTER — Other Ambulatory Visit: Payer: Self-pay | Admitting: Family Medicine

## 2016-07-30 ENCOUNTER — Other Ambulatory Visit (HOSPITAL_COMMUNITY)
Admission: RE | Admit: 2016-07-30 | Discharge: 2016-07-30 | Disposition: A | Discharge status: Home / Self Care | Payer: Managed Care, Other (non HMO) | Source: Ambulatory Visit | Attending: Family Medicine | Admitting: Family Medicine

## 2016-07-30 DIAGNOSIS — Z01411 Encounter for gynecological examination (general) (routine) with abnormal findings: Secondary | ICD-10-CM | POA: Diagnosis present

## 2016-07-30 DIAGNOSIS — Z1151 Encounter for screening for human papillomavirus (HPV): Secondary | ICD-10-CM | POA: Insufficient documentation

## 2016-08-05 LAB — CYTOLOGY - PAP: HPV: NOT DETECTED

## 2016-08-18 ENCOUNTER — Telehealth: Payer: Self-pay

## 2016-08-18 DIAGNOSIS — R87610 Atypical squamous cells of undetermined significance on cytologic smear of cervix (ASC-US): Secondary | ICD-10-CM

## 2016-08-18 NOTE — Telephone Encounter (Signed)
Patient has been scheduled for colposcopy on 09/03/2016 with Dr.Silva by Braxton Feathersebecca Frahm. Order placed and linked to appointment.  Routing to provider for final review. Patient agreeable to disposition. Will close encounter.

## 2016-09-03 ENCOUNTER — Encounter: Payer: Self-pay | Admitting: Obstetrics and Gynecology

## 2016-09-03 ENCOUNTER — Ambulatory Visit (INDEPENDENT_AMBULATORY_CARE_PROVIDER_SITE_OTHER): Payer: Managed Care, Other (non HMO) | Admitting: Obstetrics and Gynecology

## 2016-09-03 DIAGNOSIS — R87610 Atypical squamous cells of undetermined significance on cytologic smear of cervix (ASC-US): Secondary | ICD-10-CM | POA: Diagnosis not present

## 2016-09-03 NOTE — Progress Notes (Signed)
Subjective:     Patient ID: Traci Hoffman, female   DOB: 11/05/1960, 55 y.o.   MRN: [id number]  HPI   Patient here today for colposcopy. Pap smear 07-30-16 ASCUS-H:Neg HR HPV. Patient has a history of cryotherapy to cervix 1987.  Is not using vaginal estrogen.   States she has vagal response to procedures.    Review of Systems  LMP: 2007 Contraception: Tubal     Objective:   Physical Exam  Genitourinary:     Colposcopy.  Consent for procedure.  Speculum placed in vagina.  3% acetic acid placed.  Colpo satisfactory.  Ectropion at 12:00?  No acetowhite change, mosaics or puntations.  No abnormal vessels.  ECC taken and sent to path.  Biopsy at 12:00 taken and sent to path.  This felt like a superficial specimen and was difficult to grasp with biopsy forceps.   Monsel's placed.  Minimal EBL.  No complications.     Assessment:     ASCUS - H pap.  Negative HR HPV.    Plan:     Discussion of pap, colposcopy and LEEP procedure.  Follow up biopsy results.  Instructions and precautions given.  Depending on results, would consider follow up pap in 3 - 6 months due to difficulty of biopsy.  After visit summary to patient.  __10_____ minutes face to face time of which over 50% was spent in counseling.       GYNECOLOGY  VISIT   HPI: 55 y.o.   Married  Caucasian  female   [phone] with Patient's last menstrual period was 09/29/2005.   here for  Abnormal pap and colposcopy.  Pap done through PCP office.   GYNECOLOGIC HISTORY: Patient's last menstrual period was 09/29/2005. Contraception:  Postmenopausal.        OB History    Gravida Para Term Preterm AB Living   4 2 2   2 2    SAB TAB Ectopic Multiple Live Births   2                 Patient Active Problem List   Diagnosis Date Noted  . Calcaneus fracture, left 05/22/2015    Past Medical History:  Diagnosis Date  . Elevated blood sugar   . Narcotic abuse    --Percocet and Vicodin abuse  .  Osteoporosis     Past Surgical History:  Procedure Laterality Date  . GANGLION CYST EXCISION     LEFT  . GYNECOLOGIC CRYOSURGERY  1987   CIN I  . KNEE ARTHROSCOPY     1980  . MANDIBLE SURGERY    . ORIF CALCANEOUS FRACTURE Left 05/22/2015   Procedure: OPEN REDUCTION INTERNAL FIXATION (ORIF) CALCANEOUS FRACTURE;  Surgeon: Teryl LucyJoshua Landau, MD;  Location: [facility] OR;  Service: Orthopedics;  Laterality: Left;  . ROTATOR CUFF REPAIR Left   . SHOULDER ARTHROSCOPY Right 08/2009  . TUBAL LIGATION      Current Outpatient Prescriptions  Medication Sig Dispense Refill  . acetaminophen (TYLENOL) 500 MG tablet Take 1,000 mg by mouth every 8 (eight) hours as needed for mild pain or moderate pain.    Marland Kitchen. alendronate (FOSAMAX) 70 MG tablet Take 1 tablet by mouth once a week.    . Calcium Carb-Cholecalciferol (CALCIUM 600 + D PO) Take 1 tablet by mouth daily.    . cholecalciferol (VITAMIN D) 1000 UNITS tablet Take 1,000 Units by mouth daily.    . Multiple Vitamin (MULTIVITAMIN) capsule Take 1 capsule by mouth daily.  No current facility-administered medications for this visit.      ALLERGIES: Patient has no known allergies.  Family History  Problem Relation Age of Onset  . Adopted: Yes    Social History   Social History  . Marital status: Married    Spouse name: N/A  . Number of children: N/A  . Years of education: N/A   Occupational History  . Not on file.   Social History Main Topics  . Smoking status: Never Smoker  . Smokeless tobacco: Never Used  . Alcohol use No  . Drug use: No  . Sexual activity: Yes    Partners: Male    Birth control/ protection: Post-menopausal, Surgical     Comment: Tubal   Other Topics Concern  . Not on file   Social History Narrative  . No narrative on file    ROS:  Pertinent items are noted in HPI.  PHYSICAL EXAMINATION:    BP 124/70 (BP Location: Right Arm, Patient Position: Sitting, Cuff Size: Normal)   Pulse 80   Ht 5' 3.75" (1.619 m)    Wt 148 lb 6.4 oz (67.3 kg)   LMP 09/29/2005   BMI 25.67 kg/m     General appearance: alert, cooperative and appears stated age  ASSESSMENT  ASCUS-H pap.  Negative HR HPV.   PLAN  Follow up biopsy results. Final plan pending. Will plan for follow up paps here in this office.   An After Visit Summary was printed and given to the patient.

## 2016-09-03 NOTE — Patient Instructions (Signed)

## 2016-09-05 LAB — IPS OTHER TISSUE BIOPSY

## 2016-09-10 ENCOUNTER — Telehealth: Payer: Self-pay

## 2016-09-10 ENCOUNTER — Encounter: Payer: Self-pay | Admitting: Obstetrics and Gynecology

## 2016-09-10 NOTE — Telephone Encounter (Signed)
Spoke with patient. Advised of results and message as seen below from Dr.Silva. Patient verbalizes understanding. 6 month follow up scheduled for 03/13/2017 at 1 pm with Dr.Silva. Patient is agreeable to date and time. 6 month recall placed.  Notes Recorded by Patton SallesBrook E Amundson C Silva, MD on 09/10/2016 at 10:40 AM EST Please contact patient with colposcopy results showing LGSIL. (Pap was ASCUS-H). There is no high grade disease or cancer.  No treatment is indicated. I am recommending cotesting in 6 months.  Please place in recall for June 2018 and make follow up appointment with me for that time.  Cc - Claudette LawsAmanda Dixon  Routing to provider for final review. Patient agreeable to disposition. Will close encounter.

## 2016-10-10 ENCOUNTER — Other Ambulatory Visit: Payer: Self-pay | Admitting: Family Medicine

## 2016-10-10 DIAGNOSIS — Z1231 Encounter for screening mammogram for malignant neoplasm of breast: Secondary | ICD-10-CM

## 2016-10-31 ENCOUNTER — Ambulatory Visit
Admission: RE | Admit: 2016-10-31 | Discharge: 2016-10-31 | Disposition: A | Discharge status: Home / Self Care | Payer: PRIVATE HEALTH INSURANCE | Source: Ambulatory Visit | Attending: Family Medicine | Admitting: Family Medicine

## 2016-10-31 DIAGNOSIS — Z1231 Encounter for screening mammogram for malignant neoplasm of breast: Secondary | ICD-10-CM

## 2017-03-13 ENCOUNTER — Encounter: Payer: Self-pay | Admitting: Obstetrics and Gynecology

## 2017-03-13 ENCOUNTER — Other Ambulatory Visit (HOSPITAL_COMMUNITY)
Admission: RE | Admit: 2017-03-13 | Discharge: 2017-03-13 | Disposition: A | Discharge status: Home / Self Care | Payer: PRIVATE HEALTH INSURANCE | Source: Ambulatory Visit | Attending: Obstetrics and Gynecology | Admitting: Obstetrics and Gynecology

## 2017-03-13 ENCOUNTER — Ambulatory Visit: Payer: PRIVATE HEALTH INSURANCE | Admitting: Obstetrics and Gynecology

## 2017-03-13 VITALS — BP 110/64 | HR 80 | Resp 16 | Wt 139.0 lb

## 2017-03-13 DIAGNOSIS — N870 Mild cervical dysplasia: Secondary | ICD-10-CM

## 2017-03-13 NOTE — Progress Notes (Signed)
GYNECOLOGY  VISIT   HPI: 56 y.o.   Married  Caucasian  female   [phone] with Patient's last menstrual period was 09/29/2005.   here for  6 month cotesting.  Pap 07/30/16 - ASCUS-H, neg HR HPV.  Colposcopy 09/03/16 - ECC showed LGSIL squamous dysplasia and benign endocervical cells, bx at 2:00 showed LGSIL.  P16 staining negative in both specimens.  Hx prior cryotherapy.    GYNECOLOGIC HISTORY: Patient's last menstrual period was 09/29/2005. Contraception:  Postmenoapusal Menopausal hormone therapy:  none Last mammogram:  10/31/16 BIRADS 1 negative/density c Last pap smear:   07/30/16 Pap Smear: ASCUS-H -- HR HPV negative 09/03/16 Colposcopy -- LGSIL        OB History    Gravida Para Term Preterm AB Living   4 2 2   2 2    SAB TAB Ectopic Multiple Live Births   2                 Patient Active Problem List   Diagnosis Date Noted  . Calcaneus fracture, left 05/22/2015    Past Medical History:  Diagnosis Date  . Cervical dysplasia, mild 2017   Cotesting due in June 2018.   Marland Kitchen Elevated blood sugar   . Narcotic abuse    --Percocet and Vicodin abuse  . Osteoporosis     Past Surgical History:  Procedure Laterality Date  . GANGLION CYST EXCISION     LEFT  . GYNECOLOGIC CRYOSURGERY  1987   CIN I  . KNEE ARTHROSCOPY     1980  . MANDIBLE SURGERY    . ORIF CALCANEOUS FRACTURE Left 05/22/2015   Procedure: OPEN REDUCTION INTERNAL FIXATION (ORIF) CALCANEOUS FRACTURE;  Surgeon: Teryl Lucy, MD;  Location: [facility] OR;  Service: Orthopedics;  Laterality: Left;  . ROTATOR CUFF REPAIR Left   . SHOULDER ARTHROSCOPY Right 08/2009  . TUBAL LIGATION      Current Outpatient Prescriptions  Medication Sig Dispense Refill  . acetaminophen (TYLENOL) 500 MG tablet Take 1,000 mg by mouth every 8 (eight) hours as needed for mild pain or moderate pain.    Marland Kitchen alendronate (FOSAMAX) 70 MG tablet Take 1 tablet by mouth once a week.    . Calcium Carb-Cholecalciferol (CALCIUM 600 + D PO) Take 1 tablet by  mouth daily.    . cholecalciferol (VITAMIN D) 1000 UNITS tablet Take 1,000 Units by mouth daily.    . Multiple Vitamin (MULTIVITAMIN) capsule Take 1 capsule by mouth daily.     No current facility-administered medications for this visit.      ALLERGIES: Patient has no known allergies.  Family History  Problem Relation Age of Onset  . Adopted: Yes    Social History   Social History  . Marital status: Married    Spouse name: N/A  . Number of children: N/A  . Years of education: N/A   Occupational History  . Not on file.   Social History Main Topics  . Smoking status: Never Smoker  . Smokeless tobacco: Never Used  . Alcohol use No  . Drug use: No  . Sexual activity: Yes    Partners: Male    Birth control/ protection: Post-menopausal, Surgical     Comment: Tubal   Other Topics Concern  . Not on file   Social History Narrative  . No narrative on file    ROS:  Pertinent items are noted in HPI.  PHYSICAL EXAMINATION:    LMP 09/29/2005     General appearance: alert, cooperative and  appears stated age   Pelvic: External genitalia:  no lesions              Urethra:  normal appearing urethra with no masses, tenderness or lesions              Bartholins and Skenes: normal                 Vagina: normal appearing vagina with normal color and discharge, no lesions              Cervix:  Mild erythema at 12:00 consistent with transformation zone.                 Bimanual Exam:  Uterus:  normal size, contour, position, consistency, mobility, non-tender              Adnexa: no mass, fullness, tenderness          Chaperone was present for exam.  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 Hx prior ASCUS-H and neg HR HPV.  Hx cryotherapy in past.   PLAN  Discussion of prior pap and colposcopic bx findings. Follow up cotesting.  Plan for annual exam and cotesting in 6 months.    An After Visit Summary was printed and given to the patient.  ___15___ minutes  face to face time of which over 50% was spent in counseling.

## 2017-03-18 LAB — CYTOLOGY - PAP
Diagnosis: NEGATIVE
HPV: NOT DETECTED

## 2017-03-19 ENCOUNTER — Telehealth: Payer: Self-pay

## 2017-03-19 NOTE — Telephone Encounter (Signed)
I apologize. This is 08 recall for 08/2017.

## 2017-03-19 NOTE — Telephone Encounter (Signed)
-----   Message from Patton SallesBrook E Amundson C Silva, MD sent at 03/18/2017  5:40 PM EDT ----- Please contact patient regarding her 6 month pap follow up.  The pap is normal and the HR HPV testing is negative.  She should be in 6 month recall for December 2018.  She has an appt already. 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

## 2017-03-19 NOTE — Telephone Encounter (Signed)
Spoke with patient. Advised of message as seen below from Dr.Silva. Patient is agreeable and verbalizes understanding. 06 recall placed.  Routing to provider for final review. Patient agreeable to disposition. Will close encounter.

## 2017-03-19 NOTE — Telephone Encounter (Signed)
08 recall should be for December 2018. Just confirming.

## 2017-09-04 ENCOUNTER — Ambulatory Visit: Payer: PRIVATE HEALTH INSURANCE | Admitting: Obstetrics and Gynecology

## 2017-09-04 ENCOUNTER — Other Ambulatory Visit (HOSPITAL_COMMUNITY)
Admission: RE | Admit: 2017-09-04 | Discharge: 2017-09-04 | Disposition: A | Discharge status: Home / Self Care | Payer: PRIVATE HEALTH INSURANCE | Source: Ambulatory Visit | Attending: Obstetrics and Gynecology | Admitting: Obstetrics and Gynecology

## 2017-09-04 ENCOUNTER — Other Ambulatory Visit: Payer: Self-pay

## 2017-09-04 ENCOUNTER — Encounter: Payer: Self-pay | Admitting: Obstetrics and Gynecology

## 2017-09-04 VITALS — BP 122/60 | HR 80 | Resp 16 | Ht 63.0 in | Wt 140.0 lb

## 2017-09-04 DIAGNOSIS — M810 Age-related osteoporosis without current pathological fracture: Secondary | ICD-10-CM | POA: Diagnosis not present

## 2017-09-04 DIAGNOSIS — N870 Mild cervical dysplasia: Secondary | ICD-10-CM | POA: Diagnosis not present

## 2017-09-04 DIAGNOSIS — N6320 Unspecified lump in the left breast, unspecified quadrant: Secondary | ICD-10-CM

## 2017-09-04 DIAGNOSIS — Z01419 Encounter for gynecological examination (general) (routine) without abnormal findings: Secondary | ICD-10-CM | POA: Diagnosis not present

## 2017-09-04 NOTE — Progress Notes (Signed)
56 y.o. [id number] Married Caucasian female here for annual exam and cotesting.   No vaginal bleeding or spotting.   Recent pap hx: Pap 07/30/16 - ASCUS-H, neg HR HPV.  Colposcopy 09/03/16 - Satisfactory. ECC showed LGSIL squamous dysplasia and benign endocervical cells, bx at 2:00 showed LGSIL.  P16 staining negative in both specimens. Pap 03/13/16 - WNL, negative HR HPV.   Hx prior cryotherapy.    Labs with PCP last month.  Did flu vaccine.  Wants shingles vaccine.   PCP: Erby Pian. Smith, MD    Patient's last menstrual period was 09/29/2005.           Sexually active: Yes.    The current method of family planning is post menopausal status.    Exercising: Yes.    running Smoker:  no  Health Maintenance: Pap:  03-13-17 WNL NEG HR HPV 07-30-16 ASC-H NEG HR HPV- had Colpo 09-03-16 - LGSIL History of abnormal Pap:  yes MMG:  10-31-16 WNL BI RADS CAT 1 Colonoscopy:  2014 Normal  BMD:   07/2017 Osteoporosis [facility].  She will stop Fosamax and start Reclast through her PCP. TDaP:  11-30-13 Gardasil:   N/A HIV: 2012- NEG per patient  Hep C: 07/2017 NEG -PCP Screening Labs:  Hb today: PCP does labs    reports that  has never smoked. she has never used smokeless tobacco. She reports that she does not drink alcohol or use drugs.  Past Medical History:  Diagnosis Date  . Cervical dysplasia, mild 2017   Cotesting due in June 2018.   Marland Kitchen. Elevated blood sugar   . 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 Osteoporosis     Past Surgical History:  Procedure Laterality Date  . GANGLION CYST EXCISION     LEFT  . GYNECOLOGIC CRYOSURGERY  1987   CIN I  . KNEE ARTHROSCOPY     1980  . MANDIBLE SURGERY    . ORIF CALCANEOUS FRACTURE Left 05/22/2015   Procedure: OPEN REDUCTION INTERNAL FIXATION (ORIF) CALCANEOUS FRACTURE;  Surgeon: Teryl LucyJoshua Landau, MD;  Location: [facility] OR;  Service: Orthopedics;  Laterality: Left;  . ROTATOR CUFF REPAIR Left   . SHOULDER ARTHROSCOPY Right 08/2009  . TUBAL  LIGATION      Current Outpatient Medications  Medication Sig Dispense Refill  . acetaminophen (TYLENOL) 500 MG tablet Take 1,000 mg by mouth every 8 (eight) hours as needed for mild pain or moderate pain.    . Calcium Carb-Cholecalciferol (CALCIUM 600 + D PO) Take 1 tablet by mouth daily.    . cholecalciferol (VITAMIN D) 1000 UNITS tablet Take 1,000 Units by mouth daily.    . meloxicam (MOBIC) 15 MG tablet Take 15 mg by mouth daily.    . Multiple Vitamin (MULTIVITAMIN) capsule Take 1 capsule by mouth daily.    . zoledronic acid (RECLAST) 5 MG/100ML SOLN injection Inject 5 mg into the vein once.     No current facility-administered medications for this visit.     Family History  Adopted: Yes    ROS:  Pertinent items are noted in HPI.  Otherwise, a comprehensive ROS was negative.  Exam:   BP 122/60 (BP Location: Right Arm, Patient Position: Sitting, Cuff Size: Normal)   Pulse 80   Resp 16   Ht 5\' 3"  (1.6 m)   Wt 140 lb (63.5 kg)   LMP 09/29/2005   BMI 24.80 kg/m     General appearance: alert, cooperative and appears stated age Head: Normocephalic,  without obvious abnormality, atraumatic Neck: no adenopathy, supple, symmetrical, trachea midline and thyroid normal to inspection and palpation Lungs: clear to auscultation bilaterally Breasts: right - normal appearance, no masses or tenderness, No nipple retraction or dimpling, No nipple discharge or bleeding, No axillary or supraclavicular adenopathy.  Left breast with 3 - 4 mm firm mass at 2:00 just outside the areola, no nodes, retractions, or nipple discharge. Heart: regular rate and rhythm Abdomen: soft, non-tender; no masses, no organomegaly Extremities: extremities normal, atraumatic, no cyanosis or edema Skin: Skin color, texture, turgor normal. No rashes or lesions Lymph nodes: Cervical, supraclavicular, and axillary nodes normal. No abnormal inguinal nodes palpated Neurologic: Grossly normal  Pelvic: External genitalia:   no lesions              Urethra:  normal appearing urethra with no masses, tenderness or lesions              Bartholins and Skenes: normal                 Vagina: normal appearing vagina with normal color and discharge, no lesions              Cervix: no lesions              Pap taken: Yes.   Bimanual Exam:  Uterus:  normal size, contour, position, consistency, mobility, non-tender              Adnexa: no mass, fullness, tenderness              Rectal exam: Yes.  .  Confirms.              Anus:  normal sphincter tone, no lesions  Chaperone was present for exam.  Assessment:   Well woman visit with normal exam. LGSIL by colposcopy biopsy.  Neg P16. Hx prior ASCUS-H and neg HR HPV.  Hx cryotherapy in past.  Osteoporosis. Starting Reclast.  Left breast mass.  Plan: Dx left mammogram and ultrasound.  Recommended self breast awareness. Pap and HR HPV as above. Guidelines for Calcium, Vitamin D, regular exercise program including cardiovascular and weight bearing exercise. Reclast per PCP.   Follow up annually and prn.  After visit summary provided.

## 2017-09-04 NOTE — Patient Instructions (Signed)

## 2017-09-04 NOTE — Progress Notes (Signed)
Patient scheduled while in office. Spoke with Traci Hoffman at [facility]. Patient scheduled for left breast diagnistic MMG and US on 09/09/17 arriving at 2:10pm for 2:30pm appt. Patient verbalizes understanding and is agreeable.

## 2017-09-08 ENCOUNTER — Other Ambulatory Visit (HOSPITAL_COMMUNITY): Payer: Self-pay

## 2017-09-09 ENCOUNTER — Ambulatory Visit
Admission: RE | Admit: 2017-09-09 | Discharge: 2017-09-09 | Disposition: A | Discharge status: Home / Self Care | Payer: PRIVATE HEALTH INSURANCE | Source: Ambulatory Visit | Attending: Obstetrics and Gynecology | Admitting: Obstetrics and Gynecology

## 2017-09-09 ENCOUNTER — Ambulatory Visit (HOSPITAL_COMMUNITY)
Admission: RE | Admit: 2017-09-09 | Discharge: 2017-09-09 | Disposition: A | Discharge status: Home / Self Care | Payer: PRIVATE HEALTH INSURANCE | Source: Ambulatory Visit | Attending: Sports Medicine | Admitting: Sports Medicine

## 2017-09-09 DIAGNOSIS — N6320 Unspecified lump in the left breast, unspecified quadrant: Secondary | ICD-10-CM

## 2017-09-09 DIAGNOSIS — M810 Age-related osteoporosis without current pathological fracture: Secondary | ICD-10-CM | POA: Diagnosis present

## 2017-09-09 LAB — CYTOLOGY - PAP: HPV: NOT DETECTED

## 2017-09-09 MED ORDER — ZOLEDRONIC ACID 5 MG/100ML IV SOLN
5.0000 mg | Freq: Once | INTRAVENOUS | Status: AC
  Administered 2017-09-09: 5 mg via INTRAVENOUS

## 2017-09-09 MED ORDER — ZOLEDRONIC ACID 5 MG/100ML IV SOLN
INTRAVENOUS | Status: AC
  Administered 2017-09-09: 5 mg via INTRAVENOUS
  Filled 2017-09-09: qty 100

## 2017-09-11 ENCOUNTER — Telehealth: Payer: Self-pay

## 2017-09-11 DIAGNOSIS — R87619 Unspecified abnormal cytological findings in specimens from cervix uteri: Secondary | ICD-10-CM

## 2017-09-11 NOTE — Telephone Encounter (Signed)
Spoke with patient. Results given. Patient verbalizes understanding. Patient is aware Dr.Silva is out on leave and would like to wait to be seen when she returns. Appointment scheduled for 10/14/2017 at 10 am with Dr.Silva.  Instructions given. Motrin 800 mg po x , one hour before appointment with food. Make sure to eat a meal before appointment and drink plenty of fluids. Patient agreeable and verbalized understanding of all instructions. Orders placed.  Cc: Dr.Silva  Routing to covering provider for final review. Patient agreeable to disposition. Will close encounter.

## 2017-09-11 NOTE — Telephone Encounter (Signed)
-----   Message from Romualdo BolkJill Evelyn Jertson, MD sent at 09/09/2017  1:07 PM EST ----- Please inform the patient that she has atypical glandular cells on her pap. Her hpv testing is negative. She needs to come in for a colposcopy and endometrial biopsy. Please set her up. She is a patient's of Dr Edward JollySilva.

## 2017-10-12 ENCOUNTER — Telehealth: Payer: Self-pay | Admitting: Obstetrics and Gynecology

## 2017-10-12 NOTE — Telephone Encounter (Signed)
Left patient a message to call back to reschedule a future appointment that was cancelled by the provider on 10/14/17 for a colposcopy. Routing to triage to assist with rescheduling.

## 2017-10-14 ENCOUNTER — Ambulatory Visit: Payer: PRIVATE HEALTH INSURANCE | Admitting: Obstetrics and Gynecology

## 2017-10-22 NOTE — Telephone Encounter (Signed)
Spoke with patient. Patient would like to reschedule her colposcopy at this time. Patient is postmenopausal. Appointment rescheduled to 10/28/2017 at 1 pm with Dr.Silva. Patient is agreeable to date and time.  Routing to provider for final review. Patient agreeable to disposition. Will close encounter.

## 2017-10-26 DIAGNOSIS — J0100 Acute maxillary sinusitis, unspecified: Secondary | ICD-10-CM | POA: Insufficient documentation

## 2017-10-28 ENCOUNTER — Ambulatory Visit (INDEPENDENT_AMBULATORY_CARE_PROVIDER_SITE_OTHER): Payer: PRIVATE HEALTH INSURANCE | Admitting: Obstetrics and Gynecology

## 2017-10-28 ENCOUNTER — Encounter: Payer: Self-pay | Admitting: Obstetrics and Gynecology

## 2017-10-28 ENCOUNTER — Other Ambulatory Visit: Payer: Self-pay

## 2017-10-28 DIAGNOSIS — R87619 Unspecified abnormal cytological findings in specimens from cervix uteri: Secondary | ICD-10-CM | POA: Diagnosis not present

## 2017-10-28 NOTE — Progress Notes (Signed)
Subjective:     Patient ID: Norberto Sorensoniane L Mayson, female   DOB: 1961/07/28, 57 y.o.   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   No HRT.  No vaginal spotting.   Review of Systems LMP: Postmenopausal.  LMP age 57 yo.  Contraception: Postmenopausal Patient states that she took Ibuprofen 800mg  about an hour ago    Objective:   Physical Exam  Genitourinary:     Colposcopy performed after consent obtained.  3% acetic acid.  Satisfactory.  White light and green filter used.  Small acetowhite change at 6:00 and 3:00 just inside the  junction.  ECC, biopsy at 6:00, and biopsy at 3:00 all done separately and to pathology.  Tenaculum to anterior cervix.  EMB performed with Pipelle with 2 passes to 7 cm.  Tissue to pathology.  Monsel's to cervix.  Minimal EBL.  No complications.     Assessment:     AGUS pap and negative HR HPV.  Prior LGSIL.  Remote hx cryotherapy of cervix.    Plan:      Discussion of AGUS paps.  Follow up biopsy results.  Discussed potential for repeat cotesting in one year, conization of cervix, or even hysterectomy depending on results.  Post biopsy instructions/precautions given.   After visit summary to patient.

## 2017-10-28 NOTE — Patient Instructions (Signed)
Colposcopy, Care After  This sheet gives you information about how to care for yourself after your procedure. Your doctor may also give you more specific instructions. If you have problems or questions, contact your doctor.  What can I expect after the procedure?  If you did not have a tissue sample removed (did not have a biopsy), you may only have some spotting for a few days. You can go back to your normal activities.  If you had a tissue sample removed, it is common to have:  · Soreness and pain. This may last for a few days.  · Light-headedness.  · Mild bleeding from your vagina or dark-colored, grainy discharge from your vagina. This may last for a few days. You may need to wear a sanitary pad.  · Spotting for at least 48 hours after the procedure.    Follow these instructions at home:  · Take over-the-counter and prescription medicines only as told by your doctor. Ask your doctor what medicines you can start taking again. This is very important if you take blood-thinning medicine.  · Do not drive or use heavy machinery while taking prescription pain medicine.  · For 3 days, or as long as your doctor tells you, avoid:  ? Douching.  ? Using tampons.  ? Having sex.  · If you use birth control (contraception), keep using it.  · Limit activity for the first day after the procedure. Ask your doctor what activities are safe for you.  · It is up to you to get the results of your procedure. Ask your doctor when your results will be ready.  · Keep all follow-up visits as told by your doctor. This is important.  Contact a doctor if:  · You get a skin rash.  Get help right away if:  · You are bleeding a lot from your vagina. It is a lot of bleeding if you are using more than one pad an hour for 2 hours in a row.  · You have clumps of blood (blood clots) coming from your vagina.  · You have a fever.  · You have chills  · You have pain in your lower belly (pelvic area).  · You have signs of infection, such as vaginal  discharge that is:  ? Different than usual.  ? Yellow.  ? Bad-smelling.  · You have very pain or cramps in your lower belly that do not get better with medicine.  · You feel light-headed.  · You feel dizzy.  · You pass out (faint).  Summary  · If you did not have a tissue sample removed (did not have a biopsy), you may only have some spotting for a few days. You can go back to your normal activities.  · If you had a tissue sample removed, it is common to have mild pain and spotting for 48 hours.  · For 3 days, or as long as your doctor tells you, avoid douching, using tampons and having sex.  · Get help right away if you have bleeding, very bad pain, or signs of infection.  This information is not intended to replace advice given to you by your health care provider. Make sure you discuss any questions you have with your health care provider.  Document Released: 03/03/2008 Document Revised: 06/04/2016 Document Reviewed: 06/04/2016  Elsevier Interactive Patient Education © 2018 Elsevier Inc[employer]

## 2017-11-02 ENCOUNTER — Telehealth: Payer: Self-pay | Admitting: Obstetrics and Gynecology

## 2017-11-02 ENCOUNTER — Encounter: Payer: Self-pay | Admitting: Obstetrics and Gynecology

## 2017-11-02 ENCOUNTER — Other Ambulatory Visit: Payer: Self-pay

## 2017-11-02 ENCOUNTER — Ambulatory Visit: Payer: PRIVATE HEALTH INSURANCE | Admitting: Obstetrics and Gynecology

## 2017-11-02 VITALS — BP 122/70 | HR 92 | Resp 16 | Wt 143.0 lb

## 2017-11-02 DIAGNOSIS — Z9889 Other specified postprocedural states: Secondary | ICD-10-CM | POA: Diagnosis not present

## 2017-11-02 DIAGNOSIS — N939 Abnormal uterine and vaginal bleeding, unspecified: Secondary | ICD-10-CM | POA: Diagnosis not present

## 2017-11-02 NOTE — Progress Notes (Signed)
GYNECOLOGY  VISIT   HPI: 57 y.o.   Married  Caucasian  female   [phone] with Patient's last menstrual period was 09/29/2005.   here for bleeding after colpo. Patient states that the bleeding started on Saturday and is enough to where she needs to wear a pad. Occasional cramps per patient.  Had colpo with biopsies and EMB on 10/28/16.  Results pending.   Today the bleeding is noticeable with wiping.  No clots.  No fevers but occasional cramping.  No pain med use.   GYNECOLOGIC HISTORY: Patient's last menstrual period was 09/29/2005. 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 Calcaneus fracture, left 05/22/2015    Past Medical History:  Diagnosis Date  . Cervical dysplasia, mild 2017   Cotesting due in June 2018.   Marland Kitchen Elevated blood sugar   . 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 Osteoporosis     Past Surgical History:  Procedure Laterality Date  . GANGLION CYST EXCISION     LEFT  . GYNECOLOGIC CRYOSURGERY  1987   CIN I  . KNEE ARTHROSCOPY     1980  . MANDIBLE SURGERY    . ORIF CALCANEOUS FRACTURE Left 05/22/2015   Procedure: OPEN REDUCTION INTERNAL FIXATION (ORIF) CALCANEOUS FRACTURE;  Surgeon: Teryl Lucy, MD;  Location: [facility] OR;  Service: Orthopedics;  Laterality: Left;  . ROTATOR CUFF REPAIR Left   . SHOULDER ARTHROSCOPY Right 08/2009  . TUBAL LIGATION      Current Outpatient Medications  Medication Sig Dispense Refill  . acetaminophen (TYLENOL) 500 MG tablet Take 1,000 mg by mouth every 8 (eight) hours as needed for mild pain or moderate  pain.    . cholecalciferol (VITAMIN D) 1000 UNITS tablet Take 1,000 Units by mouth daily.    . meloxicam (MOBIC) 15 MG tablet Take 15 mg by mouth daily.    . Multiple Vitamin (MULTIVITAMIN) capsule Take 1 capsule by mouth daily.    . Omega-3 Fatty Acids (FISH OIL) 1000 MG CAPS 2,000 mg 1 (one) hour before bedtime.    . zoledronic acid (RECLAST) 5 MG/100ML SOLN injection Inject 5 mg into the vein once.     No current facility-administered medications for this visit.      ALLERGIES: Patient has no known allergies.  Family History  Adopted: Yes    Social History   Socioeconomic History  . Marital status: Married    Spouse name: Not on file  . Number of children: Not on file  . Years of education: Not on file  . Highest education level: Not on file  Social Needs  . Financial resource strain: Not on file  . Food insecurity - worry: Not on file  . Food insecurity - inability: Not on file  . Transportation needs - medical: Not on file  . Transportation needs - non-medical: Not on file  Occupational History  . Not  on file  Tobacco Use  . Smoking status: Never Smoker  . Smokeless tobacco: Never Used  Substance and Sexual Activity  . Alcohol use: No    Alcohol/week: 0.0 oz  . Drug use: No  . Sexual activity: Yes    Partners: Male    Birth control/protection: Post-menopausal, Surgical    Comment: Tubal  Other Topics Concern  . Not on file  Social History Narrative  . Not on file    ROS:  Pertinent items are noted in HPI.  PHYSICAL EXAMINATION:    BP 122/70 (BP Location: Right Arm, Patient Position: Sitting, Cuff Size: Normal)   Pulse 92   Resp 16   Wt 143 lb (64.9 kg)   LMP 09/29/2005   BMI 25.33 kg/m     General appearance: alert, cooperative and appears stated age   Pelvic: External genitalia:  no lesions              Urethra:  normal appearing urethra with no masses, tenderness or lesions              Bartholins and Skenes: normal                 Vagina:  normal appearing vagina with normal color and discharge, no lesions              Cervix:  Biopsy sites at 12 and 6:00 with no active bleeding.  Silver nitrate used briefly at these sites. No active bleeding from inside uterus.                Bimanual Exam:  Uterus:  normal size, contour, position, consistency, mobility, non-tender              Adnexa: no mass, fullness, tenderness            Chaperone was present for exam.  ASSESSMENT  AGUS pap.  Status post colposcopy with bx, ECC and EMB.  No sign of endometritis.   PLAN  Reassurance given.  Will contact patient when results are back from pathology.  At a minimum, will plan for a pap and HR HPV in one year.  An After Visit Summary was printed and given to the patient.  __15____ minutes face to face time of which over 50% was spent in counseling.

## 2017-11-02 NOTE — Telephone Encounter (Signed)
Spoke with patient. Patient states that she had dark brown spotting after her colposcopy for 2 days. On Saturday 10/31/2017 she began to have bright red spotting. States she is having to wear a panty liner. Denies any pain. Advised will need to be seen for further evaluation. Offered appointment today at 12 pm. Patient lives in [place] and is unable to make it to the office by 12 pm. Advised will review with Dr.Silva and return call.

## 2017-11-02 NOTE — Telephone Encounter (Signed)
Patient is spotting after colposcopy.

## 2017-11-05 NOTE — Telephone Encounter (Signed)
Patient was seen for follow up from colposcopy on 11/02/2017 with Dr.Silva. Requesting results from colposcopy on 10/28/2017. Spoke with patient results given as seen below. Patient verbalizes understanding. 08 recall placed. Patient declines to schedule aex at this time states she will return call to schedule.  Notes recorded by Patton SallesAmundson C Silva, Brook E, MD on 11/04/2017 at 5:15 AM EST Please report final colpo and EMB results done for evaluation of AGUS pap.  Colposcopic bx showed LGSIL, low grade dysplasia.  The ECC and were negative and normal.  By protocol, she is due for cotesing (pap and HR HP) in 12 months.  Please place 08 recall and schedule an appointment with me for 12 months from now for her annual exam.  Will close encounter.

## 2017-11-05 NOTE — Telephone Encounter (Signed)
Patient is asking if Dr.Silva has received the results from her colposcopy?

## 2017-11-06 ENCOUNTER — Other Ambulatory Visit: Payer: Self-pay | Admitting: Family Medicine

## 2017-11-06 DIAGNOSIS — Z1231 Encounter for screening mammogram for malignant neoplasm of breast: Secondary | ICD-10-CM

## 2017-12-02 ENCOUNTER — Ambulatory Visit
Admission: RE | Admit: 2017-12-02 | Discharge: 2017-12-02 | Disposition: A | Discharge status: Home / Self Care | Payer: PRIVATE HEALTH INSURANCE | Source: Ambulatory Visit | Attending: Family Medicine | Admitting: Family Medicine

## 2017-12-02 DIAGNOSIS — Z1231 Encounter for screening mammogram for malignant neoplasm of breast: Secondary | ICD-10-CM

## 2018-09-09 NOTE — Progress Notes (Signed)
57 y.o. [id number] Married Caucasian female here for annual exam.    PCP:  Ewing Schlein, MD  Patient's last menstrual period was 09/29/2005.           Sexually active: Yes.   female The current method of family planning is post menopausal status.    Exercising: Yes.    running Smoker:  no  Health Maintenance: Pap: 09-04-17 AGUS:Neg HR HPV History of abnormal Pap:  Yes, pap 09-04-17 AGUS:Neg HR HPV with colpo showing LGSIL and neg.ECC.Pap 07/30/16 - ASCUS-H, neg HR HPV. Colposcopy 09/03/16 - Satisfactory. ECC showed LGSIL squamous dysplasia and benign endocervical cells, bx at 2:00 showed LGSIL. P16 staining negative in both specimens.  EMB - atrophic endometrium.  Pap 03/13/17 - WNL, negative HR HPV--Hx cryotherapy years ago MMG: 12-02-17 Neg/density C/BiRads1 Colonoscopy: 2014 normal;next 2024 BMD: 07/2017  Result :Osteoporosis--Reclast through PCP TDaP: 11-30-13 Gardasil:   no HIV:2012 Neg per patient Hep C:07/2017 Neg with PCP Screening Labs:  Hb today: PCP Shingrix;  First vaccine done.     reports that she has never smoked. She has never used smokeless tobacco. She reports that she does not drink alcohol or use drugs.  Past Medical History:  Diagnosis Date  . Cervical dysplasia, mild 2017   Cotesting due in June 2018.   Marland Kitchen Elevated blood sugar   . 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 Osteoporosis     Past Surgical History:  Procedure Laterality Date  . GANGLION CYST EXCISION     LEFT  . GYNECOLOGIC CRYOSURGERY  1987   CIN I  . KNEE ARTHROSCOPY     1980  . MANDIBLE SURGERY    . ORIF CALCANEOUS FRACTURE Left 05/22/2015   Procedure: OPEN REDUCTION INTERNAL FIXATION (ORIF) CALCANEOUS FRACTURE;  Surgeon: Teryl Lucy, MD;  Location: [facility] OR;  Service: Orthopedics;  Laterality: Left;  . ROTATOR CUFF REPAIR Left   . SHOULDER ARTHROSCOPY Right 08/2009  . TUBAL LIGATION      Current Outpatient Medications  Medication Sig Dispense Refill  . acetaminophen  (TYLENOL) 500 MG tablet Take 1,000 mg by mouth every 8 (eight) hours as needed for mild pain or moderate pain.    . meloxicam (MOBIC) 15 MG tablet Take 15 mg by mouth daily.    . Multiple Vitamin (MULTIVITAMIN) capsule Take 1 capsule by mouth daily.    . Omega-3 Fatty Acids (FISH OIL) 1000 MG CAPS 2,000 mg 1 (one) hour before bedtime.    . zoledronic acid (RECLAST) 5 MG/100ML SOLN injection Inject 5 mg into the vein once.     No current facility-administered medications for this visit.     Family History  Adopted: Yes    Review of Systems  All other systems reviewed and are negative.   Exam:   BP 102/70 (BP Location: Right Arm, Patient Position: Sitting, Cuff Size: Normal)   Pulse 80   Resp 14   Ht 5' 3.5" (1.613 m)   Wt 144 lb 3.2 oz (65.4 kg)   LMP 09/29/2005   BMI 25.14 kg/m     General appearance: alert, cooperative and appears stated age Head: Normocephalic, without obvious abnormality, atraumatic Neck: no adenopathy, supple, symmetrical, trachea midline and thyroid normal to inspection and palpation Lungs: clear to auscultation bilaterally Breasts: normal appearance, no masses or tenderness, No nipple retraction or dimpling, No nipple discharge or bleeding, No axillary or supraclavicular adenopathy Heart: regular rate and rhythm Abdomen: soft, non-tender; no masses, no organomegaly Extremities: extremities  normal, atraumatic, no cyanosis or edema Skin: Skin color, texture, turgor normal. No rashes or lesions Lymph nodes: Cervical, supraclavicular, and axillary nodes normal. No abnormal inguinal nodes palpated Neurologic: Grossly normal  Pelvic: External genitalia:  no lesions              Urethra:  normal appearing urethra with no masses, tenderness or lesions              Bartholins and Skenes: normal                 Vagina: normal appearing vagina with normal color and discharge, no lesions              Cervix: no lesions              Pap taken: Yes.   Bimanual  Exam:  Uterus:  normal size, contour, position, consistency, mobility, non-tender              Adnexa: no mass, fullness, tenderness              Rectal exam: Yes.  .  Confirms.              Anus:  normal sphincter tone, no lesions  Chaperone was present for exam.  Assessment:   Well woman visit with normal exam. Hx AGUS pap and LGSIL. Hx prior cryotherapy.  Osteoporosis.  On Reclast.   Plan: Mammogram screening. Recommended self breast awareness. Pap and HR HPV as above. Guidelines for Calcium, Vitamin D, regular exercise program including cardiovascular and weight bearing exercise. Labs with PCP. Follow up annually and prn.    After visit summary provided.

## 2018-09-10 ENCOUNTER — Other Ambulatory Visit (HOSPITAL_COMMUNITY)
Admission: RE | Admit: 2018-09-10 | Discharge: 2018-09-10 | Disposition: A | Discharge status: Home / Self Care | Payer: PRIVATE HEALTH INSURANCE | Source: Ambulatory Visit | Attending: Obstetrics and Gynecology | Admitting: Obstetrics and Gynecology

## 2018-09-10 ENCOUNTER — Other Ambulatory Visit: Payer: Self-pay

## 2018-09-10 ENCOUNTER — Encounter: Payer: Self-pay | Admitting: Obstetrics and Gynecology

## 2018-09-10 ENCOUNTER — Ambulatory Visit: Payer: PRIVATE HEALTH INSURANCE | Admitting: Obstetrics and Gynecology

## 2018-09-10 VITALS — BP 102/70 | HR 80 | Resp 14 | Ht 63.5 in | Wt 144.2 lb

## 2018-09-10 DIAGNOSIS — Z01419 Encounter for gynecological examination (general) (routine) without abnormal findings: Secondary | ICD-10-CM | POA: Insufficient documentation

## 2018-09-10 NOTE — Patient Instructions (Signed)

## 2018-09-16 LAB — CYTOLOGY - PAP: HPV: NOT DETECTED

## 2018-09-17 ENCOUNTER — Telehealth: Payer: Self-pay | Admitting: Obstetrics and Gynecology

## 2018-09-17 ENCOUNTER — Encounter: Payer: Self-pay | Admitting: Obstetrics and Gynecology

## 2018-09-17 DIAGNOSIS — R87619 Unspecified abnormal cytological findings in specimens from cervix uteri: Secondary | ICD-10-CM

## 2018-09-17 NOTE — Telephone Encounter (Signed)
Routing to Dr. Edward JollySilva to review 09/10/18 pap.

## 2018-09-17 NOTE — Telephone Encounter (Signed)
I am checking to see if my pap results are back. It was done a week ago. Thanks.

## 2018-09-20 ENCOUNTER — Encounter: Payer: Self-pay | Admitting: Obstetrics and Gynecology

## 2018-09-20 NOTE — Telephone Encounter (Signed)
-----   Message from Patton SallesBrook E Amundson C Silva, MD sent at 09/20/2018 10:39 AM EST ----- Please contact patient with results of her pap showing atypical glandular cells.  Her HPV testing is negative.  She needs a colposcopy with ECC and endometrial biopsy with me in the office.  I have reviewed her history and depending on the results of her colposcopy, we may need to move toward a conization of her cervix.

## 2018-09-20 NOTE — Telephone Encounter (Signed)
Spoke with patient, advised as seen below per Dr. Edward JollySilva. Patient is postmenopausal. Brief explanation of Colpo and EMB, questions answered. Colpo scheduled for 10/06/18 at 3pm with Dr. Edward JollySilva. Advised to take Motrin 800 mg with food and water one hour before procedure. Orders placed for precert.  Patient verbalizes understanding and is agreeable.   Routing to provider for final review. Patient is agreeable to disposition. Will close encounter.  Cc: Harland DingwallSuzy Dixon, [place] Davis

## 2018-09-20 NOTE — Telephone Encounter (Signed)
Patient sent the following correspondence through MyChart. Routing to triage to assist patient with request.  I am still waiting for results from my pap that was done about 10 days ago. I would like results before the holiday.

## 2018-09-30 ENCOUNTER — Telehealth: Payer: Self-pay | Admitting: Obstetrics and Gynecology

## 2018-09-30 NOTE — Telephone Encounter (Signed)
call to patient to get new insurance information. SPOKE to patient she was unable to talk with spouse in the car. Patient to call me back.

## 2018-10-06 ENCOUNTER — Other Ambulatory Visit: Payer: Self-pay

## 2018-10-06 ENCOUNTER — Encounter: Payer: Self-pay | Admitting: Obstetrics and Gynecology

## 2018-10-06 ENCOUNTER — Ambulatory Visit (INDEPENDENT_AMBULATORY_CARE_PROVIDER_SITE_OTHER): Payer: Managed Care, Other (non HMO) | Admitting: Obstetrics and Gynecology

## 2018-10-06 DIAGNOSIS — R87619 Unspecified abnormal cytological findings in specimens from cervix uteri: Secondary | ICD-10-CM | POA: Diagnosis not present

## 2018-10-06 NOTE — Patient Instructions (Signed)
Endometrial Biopsy, Care After This sheet gives you information about how to care for yourself after your procedure. Your health care provider may also give you more specific instructions. If you have problems or questions, contact your health care provider. What can I expect after the procedure? After the procedure, it is common to have:  Mild cramping.  A small amount of vaginal bleeding for a few days. This is normal. Follow these instructions at home:   Take over-the-counter and prescription medicines only as told by your health care provider.  Do not douche, use tampons, or have sexual intercourse until your health care provider approves.  Return to your normal activities as told by your health care provider. Ask your health care provider what activities are safe for you.  Follow instructions from your health care provider about any activity restrictions, such as restrictions on strenuous exercise or heavy lifting. Contact a health care provider if:  You have heavy bleeding, or bleed for longer than 2 days after the procedure.  You have bad smelling discharge from your vagina.  You have a fever or chills.  You have a burning sensation when urinating or you have difficulty urinating.  You have severe pain in your lower abdomen. Get help right away if:  You have severe cramps in your stomach or back.  You pass large blood clots.  Your bleeding increases.  You become weak or light-headed, or you pass out. Summary  After the procedure, it is common to have mild cramping and a small amount of vaginal bleeding for a few days.  Do not douche, use tampons, or have sexual intercourse until your health care provider approves.  Return to your normal activities as told by your health care provider. Ask your health care provider what activities are safe for you. This information is not intended to replace advice given to you by your health care provider. Make sure you discuss any  questions you have with your health care provider. Document Released: 07/06/2013 Document Revised: 10/01/2016 Document Reviewed: 10/01/2016 Elsevier Interactive Patient Education  2019 [employer] Colposcopy, Care After This sheet gives you information about how to care for yourself after your procedure. Your health care provider may also give you more specific instructions. If you have problems or questions, contact your health care provider. What can I expect after the procedure? If you had a colposcopy without a biopsy, you can expect to feel fine right away, but you may have some spotting for a few days. You can go back to your normal activities. If you had a colposcopy with a biopsy, it is common to have:  Soreness and pain. This may last for a few days.  Light-headedness.  Mild vaginal bleeding or dark-colored, grainy discharge. This may last for a few days. The discharge may be due to a solution that was used during the procedure. You may need to wear a sanitary pad during this time.  Spotting for at least 48 hours after the procedure. Follow these instructions at home:   Take over-the-counter and prescription medicines only as told by your health care provider. Talk with your health care provider about what type of over-the-counter pain medicine and prescription medicine you can start taking again. It is especially important to talk with your health care provider if you take blood-thinning medicine.  Do not drive or use heavy machinery while taking prescription pain medicine.  For at least 3 days after your procedure, or as long as told by your health  care provider, avoid: ? Douching. ? Using tampons. ? Having sexual intercourse.  Continue to use birth control (contraception).  Limit your physical activity for the first day after the procedure as told by your health care provider. Ask your health care provider what activities are safe for you.  It is up to you to get the  results of your procedure. Ask your health care provider, or the department performing the procedure, when your results will be ready.  Keep all follow-up visits as told by your health care provider. This is important. Contact a health care provider if:  You develop a skin rash. Get help right away if:  You are bleeding heavily from your vagina or you are passing blood clots. This includes using more than one sanitary pad per hour for 2 hours in a row.  You have a fever or chills.  You have pelvic pain.  You have abnormal, yellow-colored, or bad-smelling vaginal discharge. This could be a sign of infection.  You have severe pain or cramps in your lower abdomen that do not get better with medicine.  You feel light-headed or dizzy, or you faint. Summary  If you had a colposcopy without a biopsy, you can expect to feel fine right away, but you may have some spotting for a few days. You can go back to your normal activities.  If you had a colposcopy with a biopsy, you may notice mild pain and spotting for 48 hours after the procedure.  Avoid douching, using tampons, and having sexual intercourse for 3 days after the procedure or as long as told by your health care provider.  Contact your health care provider if you have bleeding, severe pain, or signs of infection. This information is not intended to replace advice given to you by your health care provider. Make sure you discuss any questions you have with your health care provider. Document Released: 07/06/2013 Document Revised: 05/02/2016 Document Reviewed: 05/02/2016 Elsevier Interactive Patient Education  2019 Elsevier Inc. Cervical Conization Cervical conization (cone biopsy) is a procedure in which a cone-shaped portion of the cervix is cut out so that it can be examined under a microscope. The procedure is done to check for cancer cells or cells that might turn into cancer (precancerous cells). You may have this procedure  if:  You have abnormal bleeding from your cervix.  You had an abnormal Pap test.  Something abnormal was seen on your cervix during an exam. This procedure is performed in either a health care provider's office or in an operating room. Tell a health care provider about:  Any allergies you have.  All medicines you are taking, including vitamins, herbs, eye drops, creams, and over-the-counter medicines.  Any problems you or family members have had with the use of anesthetic medicines.  Any blood disorders you have.  Any surgeries you have had.  Any medical conditions you have.  Your smoking habits.  When you normally have your period.  Whether you are pregnant or may be pregnant. What are the risks? Generally, this is a safe procedure. However, problems may occur, including:  Heavy bleeding for several days or weeks after the procedure.  Allergic reactions to medicines or dyes.  Increased risk of preterm labor in future pregnancies.  Infection (rare).  Damage to the cervix or other structures or organs (rare). What happens before the procedure? Staying hydrated Follow instructions from your health care provider about hydration, which may include:  Up to 2 hours before the procedure -  you may continue to drink clear liquids, such as water, clear fruit juice, black coffee, and plain tea. Eating and drinking restrictions Follow instructions from your health care provider about eating and drinking, which may include:  8 hours before the procedure - stop eating heavy meals or foods such as meat, fried foods, or fatty foods.  6 hours before the procedure - stop eating light meals or foods, such as toast or cereal.  6 hours before the procedure - stop drinking milk or drinks that contain milk.  2 hours before the procedure - stop drinking clear liquids. General instructions  Do not douche, have sex, use tampons, or use any vaginal medicines before the procedure as told  by your health care provider.  You may be asked to empty your bladder and bowel right before the procedure.  Ask your health care provider about: ? Changing or stopping your normal medicines. This is important if you take diabetes medicines or blood thinners. ? Taking medicines such as aspirin and ibuprofen. These medicines can thin your blood. Do not take these medicines before your procedure if your doctor tells you not to.  Plan to have someone take you home from the hospital or clinic. What happens during the procedure?  To reduce your risk of infection: ? Your health care team will wash or sanitize their hands. ? Your skin will be washed with soap. ? Hair may be removed from the surgical area.  You will undress from the waist down and be given a gown to wear.  You will lie on an examining table and put your feet in stirrups.  An IV tube will be inserted into one of your veins.  You will be given one or more of the following: ? A medicine to help you relax (sedative). ? A medicine to numb the area (local anesthetic). ? A medicine to make you fall asleep (general anesthetic). ? A medicine that numbs the cervix (cervical block).  A lubricated device called a speculum will be inserted into your vagina. It will be used to spread open the walls of the vagina so your health care provider can see the inside of the vagina and cervix better.  An instrument that has a magnifying lens and a light (colposcope) will let your health care provider examine the cervix more closely.  Your health care provider will apply a solution to your cervix. This turns abnormal areas a pale color.  A tissue sample will be removed from the cervix using one of the following methods: ? The cold knife method. In this method, the tissue is cut out with a knife (scalpel). ? The loop electrosurgical excision procedure (LEEP) method. In this method, the tissue is cut out with a thin wire that can burn (cauterize)  the tissue with an electrical current. ? Laser treatment method. In this method, the tissue is cut out and then cauterized with a laser beam to prevent bleeding.  Your health care provider will apply a paste over the biopsy areas to help control bleeding.  The tissue sample will be examined under a microscope. The procedure may vary among health care providers and hospitals. What happens after the procedure?  Your blood pressure, heart rate, breathing rate, and blood oxygen level will be monitored often until the medicines you were given have worn off.  If you were given a local anesthetic, you will rest at the clinic or hospital until you are stable and feel ready to go home.  If  you were given a general anesthetic, you may be monitored for a longer period of time.  You may have some cramping.  You may have bloody discharge or light to moderate bleeding.  You may have dark discharge coming from your vagina. This is from the paste used on the cervix to prevent bleeding. Summary  Cervical conization is a procedure in which a cone-shaped portion of the cervix is cut out so that it can be examined under a microscope.  The procedure is done to check for cancer cells or cells that might turn into cancer (precancerous cells). This information is not intended to replace advice given to you by your health care provider. Make sure you discuss any questions you have with your health care provider. Document Released: 06/25/2005 Document Revised: 09/17/2016 Document Reviewed: 09/17/2016 Elsevier Interactive Patient Education  2019 [employer].

## 2018-10-06 NOTE — Progress Notes (Signed)
  Subjective:     Patient ID: Traci Hoffman, female   DOB: 09-21-1961, 58 y.o.   MRN: [id number]  HPI  Patient here today for colposcopy due to pap 09-10-09 AGUS:Neg HR HPV.  Pap history:  09-04-17 AGUS:Neg HR HPV with colpo showing LGSIL and neg.ECC.Pap 07/30/16 - ASCUS-H, neg HR HPV. Colposcopy 09/03/16 -Satisfactory.ECC showed LGSIL squamous dysplasia and benign endocervical cells, bx at 2:00 showed LGSIL. P16 staining negative in both specimens.  EMB - atrophic endometrium.    Review of Systems  All other systems reviewed and are negative.   LMP:09-29-2005 Contraception: Postmenopausal     Objective:   Physical Exam Genitourinary:     Colposcopy - cervix, vagina.   Endometrial biopsy. Consent for procedures.   3% acetic acid used in vagina and on vulva. White light and green light filter used.  Colposcopy satisfactory:  Yes   ___x__          No    _____ Findings:    Atrophy noted.  Cervix:  Erythematous area at 12:00.  Vagina: no lesions.  Biopsies:   ECC, biopsy at 12:00.  Monsel's placed.  Minimal EBL  EMB. Sterile prep of cervix with betadine.  Pipelle passed x 2 to 7 cm. Minimal EBL. No complications.      Assessment:     AGUS pap, recurrent.  Hx LGSIL. Hx cryotherapy.    Plan:     Fu biopsies.  Post colposcopy/EMB instructions and precautions given. Consider cold knife conization of the cervix.  Rationale discussed.  Risks and benefits reviewed.  Risks may include but are not limited to bleeding, infection, damage to surrounding organs, cervical scarring and stenosis making future evaluation more difficult, and possible additional treatment needed such as hysterectomy.    __15_____ minutes face to face time of which over 50% was spent in counseling.   After visit summary to patient.

## 2018-10-09 DIAGNOSIS — R87619 Unspecified abnormal cytological findings in specimens from cervix uteri: Secondary | ICD-10-CM | POA: Insufficient documentation

## 2018-10-11 ENCOUNTER — Telehealth: Payer: Self-pay

## 2018-10-11 NOTE — Telephone Encounter (Signed)
Left message to call Verlisa Vara at [phone]. 

## 2018-10-11 NOTE — Telephone Encounter (Signed)
-----   Message from Patton Salles, MD sent at 10/08/2018  1:07 PM EST ----- Please report results of colposcopy with biopsies.  Cervical biopsy, ECC and endometrial biopsies are all benign.  We do not have an explanation for the AGUS cells on her pap.   I am recommending we proceed with a cold knife conization of the cervix and fractional dilation and curettage in an outpatient surgical setting.  Patient and I discussed this potential plan at her colposcopy visit.   This will need to go to precert and scheduling.   Cc- Billie Ruddy, Harland Dingwall

## 2018-10-11 NOTE — Telephone Encounter (Signed)
Patient notified of results.  Verbalized understanding of results and plan of care.   Advised she will be contacted by Thomasene Lot and Kennon Rounds to discuss surgical planning.   Patient agreeable.

## 2018-10-12 NOTE — Telephone Encounter (Signed)
Return call from patient. Surgery date options reviewed. Patient agreeable to proceed on 11-02-2018.  Surgery instruction sheet reviewed and printed copy will be mailed with hospital brochure.  Questions answered. Call back prn.  Routing to Dr Edward Jolly. Encounter closed.

## 2018-10-12 NOTE — Telephone Encounter (Signed)
Call to patient. Per ROI, can leave message on voice mail which confirms " Traci Hoffman." Left message calling regarding scheduleing recommended procedure. Call back and ask for [facility].

## 2018-10-26 ENCOUNTER — Encounter (HOSPITAL_BASED_OUTPATIENT_CLINIC_OR_DEPARTMENT_OTHER): Payer: Self-pay | Admitting: *Deleted

## 2018-10-26 ENCOUNTER — Other Ambulatory Visit: Payer: Self-pay

## 2018-10-26 NOTE — Progress Notes (Signed)
Spoke with patient via telephone for pre op interview. NPO after MN. No medications AM of surgery. Will need CBC and BMET AM of surgery. Arrival time 44.

## 2018-11-02 ENCOUNTER — Ambulatory Visit (HOSPITAL_BASED_OUTPATIENT_CLINIC_OR_DEPARTMENT_OTHER): Payer: Managed Care, Other (non HMO) | Admitting: Anesthesiology

## 2018-11-02 ENCOUNTER — Encounter (HOSPITAL_BASED_OUTPATIENT_CLINIC_OR_DEPARTMENT_OTHER)
Admission: RE | Disposition: A | Discharge status: Home / Self Care | Payer: Self-pay | Source: Ambulatory Visit | Attending: Obstetrics and Gynecology

## 2018-11-02 ENCOUNTER — Ambulatory Visit (HOSPITAL_BASED_OUTPATIENT_CLINIC_OR_DEPARTMENT_OTHER)
Admission: RE | Admit: 2018-11-02 | Discharge: 2018-11-02 | Disposition: A | Discharge status: Home / Self Care | Payer: Managed Care, Other (non HMO) | Source: Ambulatory Visit | Attending: Obstetrics and Gynecology | Admitting: Obstetrics and Gynecology

## 2018-11-02 ENCOUNTER — Encounter (HOSPITAL_BASED_OUTPATIENT_CLINIC_OR_DEPARTMENT_OTHER): Payer: Self-pay

## 2018-11-02 DIAGNOSIS — D069 Carcinoma in situ of cervix, unspecified: Secondary | ICD-10-CM | POA: Diagnosis not present

## 2018-11-02 DIAGNOSIS — F1120 Opioid dependence, uncomplicated: Secondary | ICD-10-CM | POA: Diagnosis not present

## 2018-11-02 DIAGNOSIS — M810 Age-related osteoporosis without current pathological fracture: Secondary | ICD-10-CM | POA: Insufficient documentation

## 2018-11-02 DIAGNOSIS — R87612 Low grade squamous intraepithelial lesion on cytologic smear of cervix (LGSIL): Secondary | ICD-10-CM | POA: Diagnosis present

## 2018-11-02 DIAGNOSIS — R87619 Unspecified abnormal cytological findings in specimens from cervix uteri: Secondary | ICD-10-CM

## 2018-11-02 HISTORY — PX: DILATION AND CURETTAGE OF UTERUS: SHX78

## 2018-11-02 HISTORY — PX: CERVICAL CONIZATION W/BX: SHX1330

## 2018-11-02 LAB — CBC
HCT: 39.9 % (ref 36.0–46.0)
Hemoglobin: 12.9 g/dL (ref 12.0–15.0)
MCH: 30.9 pg (ref 26.0–34.0)
MCHC: 32.3 g/dL (ref 30.0–36.0)
MCV: 95.5 fL (ref 80.0–100.0)
Platelets: 300 10*3/uL (ref 150–400)
RBC: 4.18 MIL/uL (ref 3.87–5.11)
RDW: 11.9 % (ref 11.5–15.5)
WBC: 4.8 10*3/uL (ref 4.0–10.5)
nRBC: 0 % (ref 0.0–0.2)

## 2018-11-02 LAB — BASIC METABOLIC PANEL
Anion gap: 8 (ref 5–15)
BUN: 23 mg/dL — ABNORMAL HIGH (ref 6–20)
CO2: 26 mmol/L (ref 22–32)
Calcium: 9 mg/dL (ref 8.9–10.3)
Chloride: 104 mmol/L (ref 98–111)
Creatinine, Ser: 0.67 mg/dL (ref 0.44–1.00)
GFR calc Af Amer: >60 mL/min (ref >60)
GFR calc non Af Amer: >60 mL/min (ref >60)
Glucose, Bld: 99 mg/dL (ref 70–99)
Potassium: 4.2 mmol/L (ref 3.5–5.1)
Sodium: 138 mmol/L (ref 135–145)

## 2018-11-02 SURGERY — CONE BIOPSY, CERVIX
Anesthesia: General

## 2018-11-02 MED ORDER — IODINE STRONG (LUGOLS) 5 % PO SOLN
ORAL | Status: DC | PRN
  Administered 2018-11-02: 0.1 mL

## 2018-11-02 MED ORDER — FENTANYL CITRATE (PF) 100 MCG/2ML IJ SOLN
INTRAMUSCULAR | Status: AC
  Filled 2018-11-02: qty 2

## 2018-11-02 MED ORDER — KETOROLAC TROMETHAMINE 30 MG/ML IJ SOLN
INTRAMUSCULAR | Status: AC
  Filled 2018-11-02: qty 1

## 2018-11-02 MED ORDER — DEXAMETHASONE SODIUM PHOSPHATE 10 MG/ML IJ SOLN
INTRAMUSCULAR | Status: AC
  Filled 2018-11-02: qty 1

## 2018-11-02 MED ORDER — LIDOCAINE-EPINEPHRINE 1 %-1:100000 IJ SOLN
INTRAMUSCULAR | Status: DC | PRN
  Administered 2018-11-02: 10 mL

## 2018-11-02 MED ORDER — ONDANSETRON HCL 4 MG/2ML IJ SOLN
INTRAMUSCULAR | Status: AC
  Filled 2018-11-02: qty 2

## 2018-11-02 MED ORDER — SODIUM CHLORIDE 0.9 % IV SOLN
INTRAVENOUS | Status: AC
  Filled 2018-11-02: qty 2

## 2018-11-02 MED ORDER — MIDAZOLAM HCL 2 MG/2ML IJ SOLN
INTRAMUSCULAR | Status: AC
  Filled 2018-11-02: qty 2

## 2018-11-02 MED ORDER — PROPOFOL 10 MG/ML IV BOLUS
INTRAVENOUS | Status: DC | PRN
  Administered 2018-11-02: 150 mg via INTRAVENOUS

## 2018-11-02 MED ORDER — LIDOCAINE 2% (20 MG/ML) 5 ML SYRINGE
INTRAMUSCULAR | Status: AC
  Filled 2018-11-02: qty 5

## 2018-11-02 MED ORDER — LIDOCAINE 2% (20 MG/ML) 5 ML SYRINGE
INTRAMUSCULAR | Status: DC | PRN
  Administered 2018-11-02: 100 mg via INTRAVENOUS

## 2018-11-02 MED ORDER — LACTATED RINGERS IV SOLN
INTRAVENOUS | Status: DC
  Filled 2018-11-02: qty 1000

## 2018-11-02 MED ORDER — PROPOFOL 10 MG/ML IV BOLUS
INTRAVENOUS | Status: AC
  Filled 2018-11-02: qty 20

## 2018-11-02 MED ORDER — ACETAMINOPHEN 500 MG PO TABS
ORAL_TABLET | ORAL | Status: AC
  Filled 2018-11-02: qty 2

## 2018-11-02 MED ORDER — PHENYLEPHRINE 40 MCG/ML (10ML) SYRINGE FOR IV PUSH (FOR BLOOD PRESSURE SUPPORT)
PREFILLED_SYRINGE | INTRAVENOUS | Status: AC
  Filled 2018-11-02: qty 10

## 2018-11-02 MED ORDER — FENTANYL CITRATE (PF) 100 MCG/2ML IJ SOLN
25.0000 ug | INTRAMUSCULAR | Status: DC | PRN
  Administered 2018-11-02: 25 ug via INTRAVENOUS
  Filled 2018-11-02: qty 1

## 2018-11-02 MED ORDER — LACTATED RINGERS IV SOLN
INTRAVENOUS | Status: DC
  Administered 2018-11-02 (×2): via INTRAVENOUS
  Filled 2018-11-02: qty 1000

## 2018-11-02 MED ORDER — PHENYLEPHRINE 40 MCG/ML (10ML) SYRINGE FOR IV PUSH (FOR BLOOD PRESSURE SUPPORT)
PREFILLED_SYRINGE | INTRAVENOUS | Status: DC | PRN
  Administered 2018-11-02 (×2): 120 ug via INTRAVENOUS
  Administered 2018-11-02: 80 ug via INTRAVENOUS

## 2018-11-02 MED ORDER — DEXAMETHASONE SODIUM PHOSPHATE 10 MG/ML IJ SOLN
INTRAMUSCULAR | Status: DC | PRN
  Administered 2018-11-02: 10 mg via INTRAVENOUS

## 2018-11-02 MED ORDER — MIDAZOLAM HCL 5 MG/5ML IJ SOLN
INTRAMUSCULAR | Status: DC | PRN
  Administered 2018-11-02: 2 mg via INTRAVENOUS

## 2018-11-02 MED ORDER — ACETAMINOPHEN 500 MG PO TABS
1000.0000 mg | ORAL_TABLET | Freq: Once | ORAL | Status: AC
  Administered 2018-11-02: 1000 mg via ORAL
  Filled 2018-11-02: qty 2

## 2018-11-02 MED ORDER — SODIUM CHLORIDE 0.9 % IV SOLN
2.0000 g | INTRAVENOUS | Status: AC
  Administered 2018-11-02: 2 g via INTRAVENOUS
  Filled 2018-11-02: qty 2

## 2018-11-02 MED ORDER — TRAMADOL HCL 50 MG PO TABS: 50.0000 mg | ORAL_TABLET | Freq: Four times a day (QID) | ORAL | 0 refills | Status: DC | PRN

## 2018-11-02 MED ORDER — KETOROLAC TROMETHAMINE 30 MG/ML IJ SOLN
INTRAMUSCULAR | Status: DC | PRN
  Administered 2018-11-02: 30 mg via INTRAVENOUS

## 2018-11-02 SURGICAL SUPPLY — 23 items
APL SWBSTK 6 STRL LF DISP (MISCELLANEOUS) ×1
APPLICATOR COTTON TIP 6 STRL (MISCELLANEOUS) ×1 IMPLANT
APPLICATOR COTTON TIP 6IN STRL (MISCELLANEOUS) ×3
BLADE SURG 11 STRL SS (BLADE) ×3 IMPLANT
CATH ROBINSON RED A/P 16FR (CATHETERS) ×3 IMPLANT
COVER WAND RF STERILE (DRAPES) ×3 IMPLANT
ELECT BALL LEEP 5MM RED (ELECTRODE) ×2 IMPLANT
GLOVE BIO SURGEON STRL SZ 6.5 (GLOVE) ×2 IMPLANT
GLOVE BIO SURGEONS STRL SZ 6.5 (GLOVE) ×1
GLOVE BIOGEL PI IND STRL 7.0 (GLOVE) ×1 IMPLANT
GLOVE BIOGEL PI INDICATOR 7.0 (GLOVE) ×2
GOWN STRL REUS W/TWL LRG LVL3 (GOWN DISPOSABLE) ×6 IMPLANT
NS IRRIG 1000ML POUR BTL (IV SOLUTION) ×3 IMPLANT
PACK VAGINAL WOMENS (CUSTOM PROCEDURE TRAY) ×3 IMPLANT
PAD OB MATERNITY 4.3X12.25 (PERSONAL CARE ITEMS) ×3 IMPLANT
SCOPETTES 8  STERILE (MISCELLANEOUS) ×2
SCOPETTES 8 STERILE (MISCELLANEOUS) ×1 IMPLANT
SPONGE SURGIFOAM ABS GEL 12-7 (HEMOSTASIS) IMPLANT
SUT CHROMIC 1 CT1 27 (SUTURE) IMPLANT
SUT SILK 2 0 SH (SUTURE) ×3 IMPLANT
SUT VIC AB 0 CT1 27 (SUTURE) ×6
SUT VIC AB 0 CT1 27XBRD ANBCTR (SUTURE) ×2 IMPLANT
TOWEL OR 17X24 6PK STRL BLUE (TOWEL DISPOSABLE) ×6 IMPLANT

## 2018-11-02 NOTE — H&P (Signed)
GYNECOLOGY  VISIT   HPI: 58 y.o.   Married  Caucasian  female   [phone] with Patient's last menstrual period was 09/29/2005.   here for   AGUS pap and negative HR HPV.  Her colposcopy was satisfactory and biopsies of the cervix, endocervix and endometrium were normal.   She has a similar history of an AGUS pap in 2018 and an ASCUS H pap in 2017.  Pap 09-04-17 - AGUS:Neg HR HPV.  Colposcopy showing LGSIL and neg ECC. Pap 07/30/16 - ASCUS-H, neg HR HPV. Colposcopy with ECC showed LGSIL squamous dysplasia and benign endocervical cells, bx at 2:00 showed LGSIL. P16 staining negative in both specimens.EMB - atrophic endometrium.  GYNECOLOGIC HISTORY: Patient's last menstrual period was 09/29/2005. Contraception:  Postmenopausal  Menopausal hormone therapy:  NA Last mammogram:  12-02-17 Neg/density C/BiRads1 Last pap smear:   See above.        OB History    Gravida  4   Para  2   Term  2   Preterm      AB  2   Living  2     SAB  2   TAB      Ectopic      Multiple      Live Births                 Patient Active Problem List   Diagnosis Date Noted  . Atypical glandular cells of undetermined significance (AGUS) on cervical Pap smear 10/09/2018  . Calcaneus fracture, left 05/22/2015    Past Medical History:  Diagnosis Date  . Cervical dysplasia, mild 2017   Cotesting due in June 2018.   Marland Kitchen. Elevated blood sugar   . 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 Osteoporosis     Past Surgical History:  Procedure Laterality Date  . GANGLION CYST EXCISION     LEFT  . GYNECOLOGIC CRYOSURGERY  1987   CIN I  . KNEE ARTHROSCOPY     1980  . MANDIBLE SURGERY    . ORIF CALCANEOUS FRACTURE Left 05/22/2015   Procedure: OPEN REDUCTION INTERNAL FIXATION (ORIF) CALCANEOUS FRACTURE;  Surgeon: Teryl LucyJoshua Landau, MD;  Location: [facility] OR;  Service: Orthopedics;  Laterality: Left;  . ROTATOR CUFF REPAIR Left   . SHOULDER ARTHROSCOPY Right 08/2009  . TUBAL LIGATION       Current Facility-Administered Medications  Medication Dose Route Frequency Provider Last Rate Last Dose  . cefoTEtan (CEFOTAN) 2 g in sodium chloride 0.9 % 100 mL IVPB  2 g Intravenous On Call to OR Patton SallesAmundson C Silva,  E, MD      . Iodine Strong (Lugols) 5 % solution    PRN Patton SallesAmundson C Silva,  E, MD   0.1 mL at 11/02/18 0910  . lactated ringers infusion   Intravenous Continuous Patton SallesAmundson C Silva,  E, MD      . lactated ringers infusion   Intravenous Continuous Eilene Ghaziose, George, MD 50 mL/hr at 11/02/18 0745       ALLERGIES: Patient has no known allergies.  Family History  Adopted: Yes    Social History   Socioeconomic History  . Marital status: Married    Spouse name: Not on file  . Number of children: Not on file  . Years of education: Not on file  . Highest education level: Not on file  Occupational History  . Not on file  Social Needs  . Financial resource strain: Not on file  .  Food insecurity:    Worry: Not on file    Inability: Not on file  . Transportation needs:    Medical: Not on file    Non-medical: Not on file  Tobacco Use  . Smoking status: Never Smoker  . Smokeless tobacco: Never Used  Substance and Sexual Activity  . Alcohol use: No    Alcohol/week: 0.0 standard drinks  . Drug use: No  . Sexual activity: Yes    Partners: Male    Birth control/protection: Post-menopausal, Surgical    Comment: Tubal  Lifestyle  . Physical activity:    Days per week: Not on file    Minutes per session: Not on file  . Stress: Not on file  Relationships  . Social connections:    Talks on phone: Not on file    Gets together: Not on file    Attends religious service: Not on file    Active member of club or organization: Not on file    Attends meetings of clubs or organizations: Not on file    Relationship status: Not on file  . Intimate partner violence:    Fear of current or ex partner: Not on file    Emotionally abused: Not on file    Physically abused:  Not on file    Forced sexual activity: Not on file  Other Topics Concern  . Not on file  Social History Narrative  . Not on file    Review of Systems  PHYSICAL EXAMINATION:    BP 128/78   Pulse 75   Temp 98 F (36.7 C) (Oral)   Resp 16   Ht 5\' 3"  (1.6 m)   Wt 65.3 kg   LMP 09/29/2005   SpO2 100%   BMI 25.49 kg/m     General appearance: alert, cooperative and appears stated age Head: Normocephalic, without obvious abnormality, atraumatic Neck: no adenopathy, supple, symmetrical, trachea midline and thyroid normal to inspection and palpation Lungs: clear to auscultation bilaterally Heart: regular rate and rhythm Abdomen: soft, non-tender, no masses,  no organomegaly Extremities: extremities normal, atraumatic, no cyanosis or edema   Pelvic:  Deferred to OR.  See colposcopy note for 10/06/18.  ASSESSMENT  Recurrent AGUS paps.   PLAN  Will proceed with cold knife conization of the cervix and fractional dilation and curettage.  Risks, benefits, and alternatives reviewed with the patient who wishes to proceed.  Questions invited and answered.    An After Visit Summary was printed and given to the patient.

## 2018-11-02 NOTE — Anesthesia Procedure Notes (Signed)
Procedure Name: LMA Insertion Date/Time: 11/02/2018 9:24 AM Performed by: Briant Sites, CRNA Pre-anesthesia Checklist: Patient identified, Emergency Drugs available, Suction available and Patient being monitored Patient Re-evaluated:Patient Re-evaluated prior to induction Oxygen Delivery Method: Circle system utilized Preoxygenation: Pre-oxygenation with 100% oxygen Induction Type: IV induction Ventilation: Mask ventilation without difficulty LMA: LMA inserted LMA Size: 4.0 Number of attempts: 1 Airway Equipment and Method: Bite block Placement Confirmation: positive ETCO2 Tube secured with: Tape Dental Injury: Teeth and Oropharynx as per pre-operative assessment

## 2018-11-02 NOTE — Anesthesia Preprocedure Evaluation (Addendum)
Anesthesia Evaluation  Patient identified by MRN, date of birth, ID band Patient awake    Reviewed: Allergy & Precautions, NPO status , Patient's Chart, lab work & pertinent test results  Airway Mallampati: I  TM Distance: >3 FB Neck ROM: Full    Dental no notable dental hx. (+) Teeth Intact, Dental Advisory Given   Pulmonary neg pulmonary ROS,    Pulmonary exam normal breath sounds clear to auscultation       Cardiovascular negative cardio ROS Normal cardiovascular exam Rhythm:Regular Rate:Normal     Neuro/Psych negative neurological ROS  negative psych ROS   GI/Hepatic negative GI ROS, (+)     substance abuse  ,   Endo/Other  negative endocrine ROS  Renal/GU negative Renal ROS  negative genitourinary   Musculoskeletal negative musculoskeletal ROS (+) narcotic dependent  Abdominal   Peds  Hematology negative hematology ROS (+)   Anesthesia Other Findings   Reproductive/Obstetrics                            Anesthesia Physical Anesthesia Plan  ASA: II  Anesthesia Plan: General   Post-op Pain Management:    Induction: Intravenous  PONV Risk Score and Plan: 3 and Midazolam, Dexamethasone and Ondansetron  Airway Management Planned: LMA  Additional Equipment:   Intra-op Plan:   Post-operative Plan: Extubation in OR  Informed Consent: I have reviewed the patients History and Physical, chart, labs and discussed the procedure including the risks, benefits and alternatives for the proposed anesthesia with the patient or authorized representative who has indicated his/her understanding and acceptance.     Dental advisory given  Plan Discussed with: CRNA  Anesthesia Plan Comments:         Anesthesia Quick Evaluation

## 2018-11-02 NOTE — Transfer of Care (Signed)
Immediate Anesthesia Transfer of Care Note  Patient: Traci Hoffman  Procedure(s) Performed: CONIZATION CERVIX WITH BIOPSY (N/A ) DILATATION AND CURETTAGE Fractional (N/A )  Patient Location: PACU  Anesthesia Type:General  Level of Consciousness: awake, alert  and oriented  Airway & Oxygen Therapy: Patient Spontanous Breathing and Patient connected to nasal cannula oxygen  Post-op Assessment: Report given to RN  Post vital signs: Reviewed and stable  Last Vitals:  Vitals Value Taken Time  BP 130/56 11/02/2018 10:07 AM  Temp    Pulse 95 11/02/2018 10:07 AM  Resp    SpO2 100 % 11/02/2018 10:07 AM  Vitals shown include unvalidated device data.  Last Pain:  Vitals:   11/02/18 0728  TempSrc:   PainSc: 0-No pain      Patients Stated Pain Goal: 4 (11/02/18 1188)  Complications: No apparent anesthesia complications

## 2018-11-02 NOTE — Discharge Instructions (Signed)
Dilation and Curettage or Vacuum Curettage, Care After These instructions give you information about caring for yourself after your procedure. Your doctor may also give you more specific instructions. Call your doctor if you have any problems or questions after your procedure. Follow these instructions at home: Activity  Do not drive or use heavy machinery while taking prescription pain medicine.  For 24 hours after your procedure, avoid driving.  Take short walks often, followed by rest periods. Ask your doctor what activities are safe for you. After one or two days, you may be able to return to your normal activities.  Do not lift anything that is heavier than 10 lb (4.5 kg) until your doctor approves.  For at least 2 weeks, or as long as told by your doctor: ? Do not douche. ? Do not use tampons. ? Do not have sex. General instructions   Take over-the-counter and prescription medicines only as told by your doctor. This is very important if you take blood thinning medicine.  Do not take baths, swim, or use a hot tub until your doctor approves. Take showers instead of baths.  Wear compression stockings as told by your doctor.  It is up to you to get the results of your procedure. Ask your doctor when your results will be ready.  Keep all follow-up visits as told by your doctor. This is important. Contact a doctor if:  You have very bad cramps that get worse or do not get better with medicine.  You have very bad pain in your belly (abdomen).  You cannot drink fluids without throwing up (vomiting).  You get pain in a different part of the area between your belly and thighs (pelvis).  You have bad-smelling discharge from your vagina.  You have a rash. Get help right away if:  You are bleeding a lot from your vagina. A lot of bleeding means soaking more than one sanitary pad in an hour, for 2 hours in a row.  You have clumps of blood (blood clots) coming from your  vagina.  You have a fever or chills.  Your belly feels very tender or hard.  You have chest pain.  You have trouble breathing.  You cough up blood.  You feel dizzy.  You feel light-headed.  You pass out (faint).  You have pain in your neck or shoulder area. Summary  Take short walks often, followed by rest periods. Ask your doctor what activities are safe for you. After one or two days, you may be able to return to your normal activities.  Do not lift anything that is heavier than 10 lb (4.5 kg) until your doctor approves.  Do not take baths, swim, or use a hot tub until your doctor approves. Take showers instead of baths.  Contact your doctor if you have any symptoms of infection, like bad-smelling discharge from your vagina. This information is not intended to replace advice given to you by your health care provider. Make sure you discuss any questions you have with your health care provider. Document Released: 06/24/2008 Document Revised: 06/02/2016 Document Reviewed: 06/02/2016 Elsevier Interactive Patient Education  2019 [employer] Cervical Conization, Care After This sheet gives you information about how to care for yourself after your procedure. Your health care provider may also give you more specific instructions. If you have problems or questions, contact your health care provider. What can I expect after the procedure? After the procedure, it is common to have:  A groggy feeling, if  you were given medicine to make you fall asleep (general anesthetic).  Cramps that feel similar to menstrual cramps.  Bloody discharge or light to moderate bleeding.  Dark discharge. This discharge may look similar to coffee grounds. This is from the paste that was applied to the cervix to control bleeding. Follow these instructions at home: Medicines   Take over-the-counter and prescription medicines only as told by your health care provider.  Do not take aspirin until your  health care provider says it is okay. Aspirin can cause bleeding.  If you are taking pain medicine: ? You may need to prevent or treat constipation. To do this, your health care provider may recommend that you:  Drink enough fluid to keep your urine clear or pale yellow.  Take over-the-counter or prescription medicines.  Eat foods that are high in fiber, such as fresh fruits and vegetables, whole grains, bran, and beans.  Limit foods that are high in fat and processed sugars, such as fried and sweet foods. ? Do not drive or use heavy machinery. General instructions  You may resume your normal diet unless your health care provider advises you not to do so.  Take showers for the first week. Do not take baths, swim, or use hot tubs until your health care provider says it is okay.  Do not douche, use tampons, or have sex until your health care provider says it is okay.  Avoid activities that require great effort, such as exercises and heavy lifting, for at least 7-14 days.  Keep all follow-up visits as told by your health care provider. This is important. Contact a health care provider if:  You develop a rash.  You are dizzy or lightheaded.  You feel nauseous or you vomit.  You develop a bad smelling discharge from your vagina. Get help right away if:  You have blood clots or bleeding that is heavier than a normal period. Bleeding that soaks a pad in less than 1 hour is considered heavy bleeding.  You have a fever.  You have increasing cramps.  You faint.  You have pain when you urinate.  You have severe or worsening pain.  Your pain is not relieved when you take medicine.  You have bloody urine.  You vomit. Summary  After the procedure, it is common to have cramps and dark or bloody discharge from your vagina.  Do not douche, use tampons, or have sex until your health care provider says it is okay.  Follow all other activity restrictions as told by your health  care provider. This information is not intended to replace advice given to you by your health care provider. Make sure you discuss any questions you have with your health care provider. Document Released: 09/15/2005 Document Revised: 09/17/2016 Document Reviewed: 09/17/2016 Elsevier Interactive Patient Education  2019 [place] [facility] Instructions  Activity: Get plenty of rest for the remainder of the day. A responsible individual must stay with you for 24 hours following the procedure.  For the next 24 hours, DO NOT: -Drive a car -Paediatric nurse -Drink alcoholic beverages -Take any medication unless instructed by your physician -Make any legal decisions or sign important papers.  Meals: Start with liquid foods such as gelatin or soup. Progress to regular foods as tolerated. Avoid greasy, spicy, heavy foods. If nausea and/or vomiting occur, drink only clear liquids until the nausea and/or vomiting subsides. Call your physician if vomiting continues.  Special Instructions/Symptoms: Your throat may feel  dry or sore from the anesthesia or the breathing tube placed in your throat during surgery. If this causes discomfort, gargle with warm salt water. The discomfort should disappear within 24 hours.  May take Ibuprofen, Advil, Motrin, or Aleve at 3:30 PM as needed.

## 2018-11-02 NOTE — Op Note (Signed)
OPERATIVE REPORT  PREOPERATIVE DIAGNOSIS:  Recurrent AGUS pap.    POSTOPERATIVE DIAGNOSIS:   Recurrent AGUS pap.    PROCEDURE:     1.  Cold knife conization of the cervix 2.  Fractional dilation and curettage   SURGEON:  Janean Sark, MD  ASSISTANT:  None  ANESTHESIA:  LMA, local with 1% lidocaine with epinephrine 1:100,000.  IVF:   1000 cc LR.   EBL:  5 cc.   UO:   30 cc.  COMPLICATIONS:  None.   INDICATIONS FOR THE PROCEDURE:    The patient is a 58 year old Caucasian female who presents with recurrent AGUS paps.  Recent colposcopy demonstrated normal cervical, endocervical and endometrial biopsies.  Her prior AGUS pap in 2018 resulted in a final colposcopic diagnosis of LGSIL and a negative endometrial biopsy.  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   A plan was made to proceed with a cold knife conization of the cervix with fractional dilation and curettage after risks, benefits, and alternatives were reviewed.     FINDINGS:    The anterior cervix demonstrated erythema and inflammatory change.    Lugols's solution demonstrated decreased uptake across the anterior cervix from 10:00 - 2:00 and the posterior cervix from 5:00 to 7:00.    SPECIMEN:    The conization of the cervix was sent to pathology with a silk suture marking the 12:00 position.  The ECC and endometrial curettings were sent to pathology separately.  PROCEDURE:  The patient was re-identified in the preop hold area.  She received Cefotetan preop antibiotics, and she received Ted hose for DVT prophylaxis.    In the operating room, she was received an LMA anesthetic and then placed in dorsal lithotomy position in the Healy stirrups.    The patient was sterilely prepped and her bladder was emptied with an in and out catheter.   A weighted speculum and a deaver retracted were used to visualize the cervix.  The cervix was  stained with Lugol's solution.  Figure of 8 sutures of 0/0 Vicryl were placed at the 3 and 9 o'clock positions of the cervix to ligate inferior descending branches of the uterine artery bilaterally. The cervix was injected circumferentially with 1% lidocaine with epinephrine 1:100,000.  The scalpel on the angled holder was used to perform the conization without difficulty.  The specimen was marked at 12:00 with a silk suture and sent to pathology.  The endocervical curettage was performed with the Kevorkian curette and sent to pathology.  A single tooth tenaculum was placed on the anterior cervical lip and the uterus was sounded to 7.5 cm.  The cervix was dilated to a number 21 Pratt dilator.  The serrated curette was used to curette the endometrium in all 4 quadrants.  Minimal tissue was obtained and sent to pathology. The monopolar cautery was used to create hemostasis in the endocervical conization bed.  Monsel's was later placed, and hemostasis was then good.  All vaginal instruments were removed.   This concluded the patient's procedure.  There were no complications.  All needle, instrument, and sponge counts were correct.   The patient was escorted to the recovery room in stable and awake condition.   Janean Sark, MD

## 2018-11-03 ENCOUNTER — Encounter (HOSPITAL_BASED_OUTPATIENT_CLINIC_OR_DEPARTMENT_OTHER): Payer: Self-pay | Admitting: Obstetrics and Gynecology

## 2018-11-03 ENCOUNTER — Telehealth: Payer: Self-pay | Admitting: Obstetrics and Gynecology

## 2018-11-03 NOTE — Anesthesia Postprocedure Evaluation (Signed)
Anesthesia Post Note  Patient: [name]  Procedure(s) Performed: CONIZATION CERVIX WITH BIOPSY (N/A ) DILATATION AND CURETTAGE Fractional (N/A )     Patient location during evaluation: PACU Anesthesia Type: General Level of consciousness: awake and alert Pain management: pain level controlled Vital Signs Assessment: post-procedure vital signs reviewed and stable Respiratory status: spontaneous breathing, nonlabored ventilation, respiratory function stable and patient connected to nasal cannula oxygen Cardiovascular status: blood pressure returned to baseline and stable Postop Assessment: no apparent nausea or vomiting Anesthetic complications: no    Last Vitals:  Vitals:   11/02/18 1045 11/02/18 1115  BP: 136/72 129/64  Pulse: 81   Resp: 13 14  Temp:  36.9 C  SpO2: 98% 100%    Last Pain:  Vitals:   11/02/18 1115  TempSrc: Oral  PainSc: 0-No pain                 Brandi Armato L Ralston Venus

## 2018-11-03 NOTE — Telephone Encounter (Signed)
Patient would like to know how long the restriction is for lifting things over 10 pounds after surgery.

## 2018-11-03 NOTE — Telephone Encounter (Signed)
Call to patient.  Advised lifting restrictions in place for 14 days until post op appointment.  Discussed need for cervix to heal. Pt verbalized understanding.  Will call back with any concerns.  Denies complaints.  Encounter to Dr. Edward Jolly and will close.

## 2018-11-04 ENCOUNTER — Encounter: Payer: Self-pay | Admitting: Obstetrics and Gynecology

## 2018-11-17 ENCOUNTER — Encounter: Payer: Self-pay | Admitting: Obstetrics and Gynecology

## 2018-11-17 ENCOUNTER — Ambulatory Visit (INDEPENDENT_AMBULATORY_CARE_PROVIDER_SITE_OTHER): Payer: Managed Care, Other (non HMO) | Admitting: Obstetrics and Gynecology

## 2018-11-17 ENCOUNTER — Telehealth: Payer: Self-pay | Admitting: Obstetrics and Gynecology

## 2018-11-17 VITALS — BP 110/70 | HR 68 | Temp 98.1°F | Resp 16 | Ht 63.5 in | Wt 145.0 lb

## 2018-11-17 DIAGNOSIS — Z9889 Other specified postprocedural states: Secondary | ICD-10-CM

## 2018-11-17 NOTE — Telephone Encounter (Signed)
Patient sent the following correspondence through MyChart. Routing to triage to assist patient with request.  I am scheduled to have my reclast infusion on the 27th of this month. I meant to ask you this morning if that would be okay.

## 2018-11-17 NOTE — Progress Notes (Signed)
GYNECOLOGY  VISIT   HPI: 58 y.o.   Married  Caucasian  female   [phone] with Patient's last menstrual period was 09/29/2005.here for 2 week follow up CONIZATION CERVIX WITH BIOPSY (N/A ) DILATATION AND CURETTAGE Fractional (N/A ).  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  ECC and EMB benign.   Some discharge and cramping.  No fever.  Is doing some heavy lifting. Cares for her grand-daughter.    GYNECOLOGIC HISTORY: Patient's last menstrual period was 09/29/2005. Contraception:  Postmenopausal Menopausal hormone therapy:  none Last mammogram: 12-02-17 Neg/density C/BiRads1 Last pap smear: 09-10-18 AGUS:Neg HR HPV--Cx bx, ECC and EMB all benign, 09-04-17 AGUS:Neg--CX bx revealed LGSIL, ECC and EMB neg.        OB History    Gravida  4   Para  2   Term  2   Preterm      AB  2   Living  2     SAB  2   TAB      Ectopic      Multiple      Live Births                 Patient Active Problem List   Diagnosis Date Noted  . Atypical glandular cells of undetermined significance (AGUS) on cervical Pap smear 10/09/2018  . Calcaneus fracture, left 05/22/2015    Past Medical History:  Diagnosis Date  . Cervical dysplasia, mild 2017   Cotesting due in June 2018.   Marland Kitchen Elevated blood sugar   . 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 Osteoporosis     Past Surgical History:  Procedure Laterality Date  . CERVICAL CONIZATION W/BX N/A 11/02/2018   Procedure: CONIZATION CERVIX WITH BIOPSY;  CIN II/III - Surgeon: Patton Salles, MD;  Location: [facility];  Service: Gynecology;  Laterality: N/A;  . DILATION AND CURETTAGE OF UTERUS N/A 11/02/2018   Procedure: DILATATION AND CURETTAGE Fractional;  Surgeon: Patton Salles, MD;  Location[facility];  Service: Gynecology;  Laterality: N/A;  Fractional D&C  . GANGLION CYST EXCISION     LEFT  . GYNECOLOGIC CRYOSURGERY  1987   CIN I  . KNEE ARTHROSCOPY      1980  . MANDIBLE SURGERY    . ORIF CALCANEOUS FRACTURE Left 05/22/2015   Procedure: OPEN REDUCTION INTERNAL FIXATION (ORIF) CALCANEOUS FRACTURE;  Surgeon: Teryl Lucy, MD;  Location: [facility] OR;  Service: Orthopedics;  Laterality: Left;  . ROTATOR CUFF REPAIR Left   . SHOULDER ARTHROSCOPY Right 08/2009  . TUBAL LIGATION      Current Outpatient Medications  Medication Sig Dispense Refill  . acetaminophen (TYLENOL) 500 MG tablet Take 1,000 mg by mouth every 8 (eight) hours as needed for mild pain or moderate pain.    . meloxicam (MOBIC) 15 MG tablet Take 15 mg by mouth daily.    . Multiple Vitamin (MULTIVITAMIN) capsule Take 1 capsule by mouth daily.    . Omega-3 Fatty Acids (FISH OIL) 1000 MG CAPS 2,000 mg 1 (one) hour before bedtime.    . zoledronic acid (RECLAST) 5 MG/100ML SOLN injection Inject 5 mg into the vein once.     No current facility-administered medications for this visit.      ALLERGIES: Patient has no known allergies.  Family History  Adopted: Yes    Social History   Socioeconomic History  . Marital status: Married  Spouse name: Not on file  . Number of children: Not on file  . Years of education: Not on file  . Highest education level: Not on file  Occupational History  . Not on file  Social Needs  . Financial resource strain: Not on file  . Food insecurity:    Worry: Not on file    Inability: Not on file  . Transportation needs:    Medical: Not on file    Non-medical: Not on file  Tobacco Use  . Smoking status: Never Smoker  . Smokeless tobacco: Never Used  Substance and Sexual Activity  . Alcohol use: No    Alcohol/week: 0.0 standard drinks  . Drug use: No  . Sexual activity: Yes    Partners: Male    Birth control/protection: Post-menopausal, Surgical    Comment: Tubal  Lifestyle  . Physical activity:    Days per week: Not on file    Minutes per session: Not on file  . Stress: Not on file  Relationships  . Social connections:    Talks on  phone: Not on file    Gets together: Not on file    Attends religious service: Not on file    Active member of club or organization: Not on file    Attends meetings of clubs or organizations: Not on file    Relationship status: Not on file  . Intimate partner violence:    Fear of current or ex partner: Not on file    Emotionally abused: Not on file    Physically abused: Not on file    Forced sexual activity: Not on file  Other Topics Concern  . Not on file  Social History Narrative  . Not on file    Review of Systems  Constitutional: Negative.   HENT: Negative.   Eyes: Negative.   Respiratory: Negative.   Cardiovascular: Negative.   Gastrointestinal: Negative.   Endocrine: Negative.   Genitourinary: Negative.   Musculoskeletal: Negative.   Skin: Negative.   Allergic/Immunologic: Negative.   Neurological: Negative.   Hematological: Negative.   Psychiatric/Behavioral: Negative.     PHYSICAL EXAMINATION:    BP 110/70 (BP Location: Right Arm, Patient Position: Sitting, Cuff Size: Large)   Pulse 68   Temp 98.1 F (36.7 C) (Oral)   Resp 16   Ht 5' 3.5" (1.613 m)   Wt 145 lb (65.8 kg)   LMP 09/29/2005   BMI 25.28 kg/m     General appearance: alert, cooperative and appears stated age   Pelvic: External genitalia:  no lesions              Urethra:  normal appearing urethra with no masses, tenderness or lesions              Bartholins and Skenes: normal                 Vagina: normal appearing vagina with normal color and discharge, no lesions              Cervix:  Sutures present.  Mucousy blood tinged discharge.  Cone bed is raw.  No CMT.                 Bimanual Exam:  Uterus:  normal size, contour, position, consistency, mobility, non-tender              Adnexa: no mass, fullness, tenderness              Chaperone was present for  exam.  ASSESSMENT  Status post cold knife conization, fractional dilation and curettage.  CIN II/III.   PLAN  Continue decreased  activity and follow up for recheck in 3 weeks.  Pathology report discussed and need for yearly paps initially following her surgical care.    An After Visit Summary was printed and given to the patient.

## 2018-11-17 NOTE — Telephone Encounter (Signed)
Dr. Edward Jolly- ok to proceed with Reclast as scheduled?

## 2018-11-17 NOTE — Telephone Encounter (Signed)
MyChart message to patient with recommendations.  Encounter Closed.

## 2018-11-17 NOTE — Telephone Encounter (Signed)
Ok for her to proceed with Reclast.

## 2018-11-24 ENCOUNTER — Other Ambulatory Visit (HOSPITAL_COMMUNITY): Payer: Self-pay | Admitting: *Deleted

## 2018-11-25 ENCOUNTER — Ambulatory Visit (HOSPITAL_COMMUNITY)
Admission: RE | Admit: 2018-11-25 | Discharge: 2018-11-25 | Disposition: A | Discharge status: Home / Self Care | Payer: Managed Care, Other (non HMO) | Source: Ambulatory Visit | Attending: Sports Medicine | Admitting: Sports Medicine

## 2018-11-25 DIAGNOSIS — M810 Age-related osteoporosis without current pathological fracture: Secondary | ICD-10-CM | POA: Diagnosis not present

## 2018-11-25 MED ORDER — ZOLEDRONIC ACID 5 MG/100ML IV SOLN
INTRAVENOUS | Status: AC
  Administered 2018-11-25: 5 mg
  Filled 2018-11-25: qty 100

## 2018-11-25 MED ORDER — ZOLEDRONIC ACID 5 MG/100ML IV SOLN: 5.0000 mg | Freq: Once | INTRAVENOUS | Status: DC

## 2018-12-10 ENCOUNTER — Ambulatory Visit (INDEPENDENT_AMBULATORY_CARE_PROVIDER_SITE_OTHER): Payer: Managed Care, Other (non HMO) | Admitting: Obstetrics and Gynecology

## 2018-12-10 ENCOUNTER — Other Ambulatory Visit: Payer: Self-pay

## 2018-12-10 ENCOUNTER — Other Ambulatory Visit: Payer: Self-pay | Admitting: Family Medicine

## 2018-12-10 ENCOUNTER — Encounter: Payer: Self-pay | Admitting: Obstetrics and Gynecology

## 2018-12-10 VITALS — BP 104/66 | HR 68 | Resp 16 | Ht 63.5 in | Wt 143.0 lb

## 2018-12-10 DIAGNOSIS — N952 Postmenopausal atrophic vaginitis: Secondary | ICD-10-CM

## 2018-12-10 DIAGNOSIS — Z1231 Encounter for screening mammogram for malignant neoplasm of breast: Secondary | ICD-10-CM

## 2018-12-10 DIAGNOSIS — Z9889 Other specified postprocedural states: Secondary | ICD-10-CM

## 2018-12-10 MED ORDER — ESTROGENS, CONJUGATED 0.625 MG/GM VA CREA: TOPICAL_CREAM | VAGINAL | 0 refills | Status: DC

## 2018-12-10 NOTE — Progress Notes (Signed)
GYNECOLOGY  VISIT   HPI: 58 y.o.   Married  Caucasian  female   [phone] with Patient's last menstrual period was 09/29/2005. here for post op check CONIZATION CERVIX WITH BIOPSY (N/A ) DILATATION AND CURETTAGE Fractional (N/A ). 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  ECC and EMB benign    No discharge or bleeding.   Reports discomfort with intercourse prior to surgery.  GYNECOLOGIC HISTORY: Patient's last menstrual period was 09/29/2005. Contraception:  Postmenopausal Menopausal hormone therapy:  none Last mammogram:  12/02/17 BIRADS 1 negative/density c Last pap smear:   09-10-18 AGUS:Neg HR HPV--Cx bx, ECC and EMB all benign, 09-04-17 AGUS:Neg--CX bx revealed LGSIL, ECC and EMB neg.        OB History    Gravida  4   Para  2   Term  2   Preterm      AB  2   Living  2     SAB  2   TAB      Ectopic      Multiple      Live Births                 Patient Active Problem List   Diagnosis Date Noted  . Atypical glandular cells of undetermined significance (AGUS) on cervical Pap smear 10/09/2018  . Calcaneus fracture, left 05/22/2015    Past Medical History:  Diagnosis Date  . Cervical dysplasia, mild 2017   Cotesting due in June 2018.   Marland Kitchen Elevated blood sugar   . 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 Osteoporosis     Past Surgical History:  Procedure Laterality Date  . CERVICAL CONIZATION W/BX N/A 11/02/2018   Procedure: CONIZATION CERVIX WITH BIOPSY;  CIN II/III - Surgeon: Patton Salles, MD;  Location: [facility];  Service: Gynecology;  Laterality: N/A;  . DILATION AND CURETTAGE OF UTERUS N/A 11/02/2018   Procedure: DILATATION AND CURETTAGE Fractional;  Surgeon: Patton Salles, MD;  Location: [facility];  Service: Gynecology;  Laterality: N/A;  Fractional D&C  . GANGLION CYST EXCISION     LEFT  . GYNECOLOGIC CRYOSURGERY  1987   CIN I  . KNEE ARTHROSCOPY     1980  .  MANDIBLE SURGERY    . ORIF CALCANEOUS FRACTURE Left 05/22/2015   Procedure: OPEN REDUCTION INTERNAL FIXATION (ORIF) CALCANEOUS FRACTURE;  Surgeon: Teryl Lucy, MD;  Location: [facility];  Service: Orthopedics;  Laterality: Left;  . ROTATOR CUFF REPAIR Left   . SHOULDER ARTHROSCOPY Right 08/2009  . TUBAL LIGATION      Current Outpatient Medications  Medication Sig Dispense Refill  . acetaminophen (TYLENOL) 500 MG tablet Take 1,000 mg by mouth every 8 (eight) hours as needed for mild pain or moderate pain.    . meloxicam (MOBIC) 15 MG tablet Take 15 mg by mouth daily.    . Multiple Vitamin (MULTIVITAMIN) capsule Take 1 capsule by mouth daily.    . Omega-3 Fatty Acids (FISH OIL) 1000 MG CAPS 2,000 mg 1 (one) hour before bedtime.    . zoledronic acid (RECLAST) 5 MG/100ML SOLN injection Inject 5 mg into the vein once.     No current facility-administered medications for this visit.      ALLERGIES: Patient has no known allergies.  Family History  Adopted: Yes    Social History   Socioeconomic History  . Marital status: Married    Spouse  name: Not on file  . Number of children: Not on file  . Years of education: Not on file  . Highest education level: Not on file  Occupational History  . Not on file  Social Needs  . Financial resource strain: Not on file  . Food insecurity:    Worry: Not on file    Inability: Not on file  . Transportation needs:    Medical: Not on file    Non-medical: Not on file  Tobacco Use  . Smoking status: Never Smoker  . Smokeless tobacco: Never Used  Substance and Sexual Activity  . Alcohol use: No    Alcohol/week: 0.0 standard drinks  . Drug use: No  . Sexual activity: Yes    Partners: Male    Birth control/protection: Post-menopausal, Surgical    Comment: Tubal  Lifestyle  . Physical activity:    Days per week: Not on file    Minutes per session: Not on file  . Stress: Not on file  Relationships  . Social connections:    Talks on phone: Not  on file    Gets together: Not on file    Attends religious service: Not on file    Active member of club or organization: Not on file    Attends meetings of clubs or organizations: Not on file    Relationship status: Not on file  . Intimate partner violence:    Fear of current or ex partner: Not on file    Emotionally abused: Not on file    Physically abused: Not on file    Forced sexual activity: Not on file  Other Topics Concern  . Not on file  Social History Narrative  . Not on file    Review of Systems  Constitutional: Negative.   HENT: Negative.   Eyes: Negative.   Respiratory: Negative.   Cardiovascular: Negative.   Gastrointestinal: Negative.   Endocrine: Negative.   Genitourinary: Negative.   Musculoskeletal: Negative.   Skin: Negative.   Allergic/Immunologic: Negative.   Neurological: Negative.   Hematological: Negative.   Psychiatric/Behavioral: Negative.     PHYSICAL EXAMINATION:    BP 104/66 (BP Location: Right Arm, Patient Position: Sitting, Cuff Size: Normal)   Pulse 68   Resp 16   Ht 5' 3.5" (1.613 m)   Wt 143 lb (64.9 kg)   LMP 09/29/2005   BMI 24.93 kg/m     General appearance: alert, cooperative and appears stated age   Pelvic: External genitalia:  no lesions              Urethra:  normal appearing urethra with no masses, tenderness or lesions              Bartholins and Skenes: normal                 Vagina: normal appearing vagina with normal color and discharge, no lesions              Cervix: consistent with prior conization. Atrophy noted.  Slightly friable.                 Bimanual Exam:  Uterus:  normal size, contour, position, consistency, mobility, non-tender              Adnexa: no mass, fullness, tenderness           Chaperone was present for exam.  ASSESSMENT  Status post cervical conization, fractional dilation and curettage. Vaginal atrophy.   PLAN  Premarin  vaginal cream. 1/2 gm pv at hs for 2 weeks and then 1/2 gm pv  at hs twice weekly.  I discussed potential effect on breast cancer.  Ok to resume intercourse after 2 weeks of vaginal estrogen therapy. Update mammogram.  Refills after mammogram back and normal.  Next pap and HR HPV with annual exam.    An After Visit Summary was printed and given to the patient.

## 2018-12-15 ENCOUNTER — Other Ambulatory Visit: Payer: Self-pay

## 2018-12-15 ENCOUNTER — Ambulatory Visit
Admission: RE | Admit: 2018-12-15 | Discharge: 2018-12-15 | Disposition: A | Discharge status: Home / Self Care | Payer: Managed Care, Other (non HMO) | Source: Ambulatory Visit | Attending: Family Medicine | Admitting: Family Medicine

## 2018-12-15 DIAGNOSIS — Z1231 Encounter for screening mammogram for malignant neoplasm of breast: Secondary | ICD-10-CM

## 2019-01-05 ENCOUNTER — Other Ambulatory Visit: Payer: Self-pay | Admitting: Obstetrics and Gynecology

## 2019-01-05 NOTE — Telephone Encounter (Signed)
Medication refill request: premarin vag cream  Last AEX:  09/10/18 Dr. Edward Jolly Next AEX: 09/16/19 Last MMG (if hormonal medication request): 12/15/18 BIRADS1:Neg  Refill authorized: 12/10/18 #[id number].   Pharmacy requesting 90 day supply. Please advise.

## 2019-07-01 ENCOUNTER — Ambulatory Visit
Admission: RE | Admit: 2019-07-01 | Discharge: 2019-07-01 | Disposition: A | Discharge status: Home / Self Care | Payer: Managed Care, Other (non HMO) | Source: Ambulatory Visit | Attending: Certified Nurse Midwife | Admitting: Certified Nurse Midwife

## 2019-07-01 ENCOUNTER — Telehealth: Payer: Self-pay | Admitting: Certified Nurse Midwife

## 2019-07-01 ENCOUNTER — Ambulatory Visit: Payer: Managed Care, Other (non HMO)

## 2019-07-01 ENCOUNTER — Encounter: Payer: Self-pay | Admitting: Certified Nurse Midwife

## 2019-07-01 ENCOUNTER — Ambulatory Visit (INDEPENDENT_AMBULATORY_CARE_PROVIDER_SITE_OTHER): Payer: Managed Care, Other (non HMO) | Admitting: Certified Nurse Midwife

## 2019-07-01 ENCOUNTER — Other Ambulatory Visit: Payer: Self-pay

## 2019-07-01 ENCOUNTER — Other Ambulatory Visit: Payer: Self-pay | Admitting: Certified Nurse Midwife

## 2019-07-01 VITALS — BP 104/62 | HR 64 | Temp 97.2°F | Resp 16 | Wt 147.0 lb

## 2019-07-01 DIAGNOSIS — N611 Abscess of the breast and nipple: Secondary | ICD-10-CM

## 2019-07-01 DIAGNOSIS — N6310 Unspecified lump in the right breast, unspecified quadrant: Secondary | ICD-10-CM

## 2019-07-01 DIAGNOSIS — M810 Age-related osteoporosis without current pathological fracture: Secondary | ICD-10-CM | POA: Insufficient documentation

## 2019-07-01 MED ORDER — CEPHALEXIN 500 MG PO CAPS: 500.0000 mg | ORAL_CAPSULE | Freq: Four times a day (QID) | ORAL | 0 refills | Status: AC

## 2019-07-01 NOTE — Patient Instructions (Signed)

## 2019-07-01 NOTE — Telephone Encounter (Signed)
Spoke with patient, advised per Melvia Heaps, CNM. Rx for keflex to verified pharmacy. OV scheduled for 07/06/19 at 4pm with Melvia Heaps, CNM. Patient verbalizes understanding and is agreeable.   Routing to provider for final review. Patient is agreeable to disposition. Will close encounter.

## 2019-07-01 NOTE — Telephone Encounter (Signed)
Patient's mammogram and ultrasound was normal and wondering if she can get a prescription for antibiotics.

## 2019-07-01 NOTE — Telephone Encounter (Signed)
Patient was seen in office today.   Melvia Heaps, CNM -please review and advise.

## 2019-07-01 NOTE — Progress Notes (Addendum)
   Subjective:   58 y.o. Married Caucasian female presents for evaluation of right breast mass. Onset of the symptoms was last night. Patient sought evaluation because of breast lump, breast tenderness, skin color change and no rash or scaling in area of concern..  Contributing factors include unknown history. . Denies chills, fatigue, but hasd flu vaccine. Patient denies history of trauma, bites, or injuries. Last mammogram was 10 months ago..     Review of Systems Pertinent items noted in HPI and remainder of comprehensive ROS otherwise negative.   Objective:   General appearance: alert, cooperative and appears stated age Breasts: normal appearance, no masses or tenderness, No nipple retraction or dimpling, No nipple discharge or bleeding, No axillary or supraclavicular adenopathy, on left breast. Right breast mass noted at aerola edge at 7-8 o'clock, slightly tender to touch, slight diffuse pink of aerola, no nipple discharge.  Right axillary area small enlarged lymph nodes upper area. Skin: warm and dry  Physical Exam Chest:       Assessment:   ASSESSMENT:Patient is diagnosed with right breast mass with enlarged lymph nodes.  R/O infection. Recent flu vaccine Plan:   PLAN: Discussed need for evaluation with diagnostic mammogram and [place] for evaluation for mass . Will call to schedule while patient is here. Patient voiced understanding.  Addendum after imaging, no masses noted, just slight increase pink of skin in lower 6-7 o"clock area of aerola. Suspect mastitis. Patient will be called and Rx for Keflex 500 mg qid x 7. Recheck in office in one week or if becomes worse. Warm soaks to area, tid.  Rv as above.  Rv prn

## 2019-07-01 NOTE — Progress Notes (Signed)
Spoke with Traci Hoffman at [facility]. Patient scheduled for today at 1pm for right breast Dx MMG and [place], if needed. Patient notified, patient is agreeable to date and time.

## 2019-07-01 NOTE — Telephone Encounter (Signed)
Traci Hoffman, CNM reviewed, see 07/01/19 OV  Addendum after imaging, no masses noted, just slight increase pink of skin in lower 6-7 o"clock area of aerola. Suspect mastitis. Patient will be called and Rx for Keflex 500 mg qid x 7. Recheck in office in one week or if becomes worse. Warm soaks to area, tid.

## 2019-07-04 ENCOUNTER — Telehealth: Payer: Self-pay | Admitting: Certified Nurse Midwife

## 2019-07-04 NOTE — Telephone Encounter (Signed)
Patient has an appointment for a breast check 07/06/19. She did state that she was tested for covid today. She said "I am  is not having any covid related symptoms".

## 2019-07-04 NOTE — Telephone Encounter (Signed)
Call to patient. Patient states she was tested for Covid today at [facility]. Patient states they said results should be back in 2-3 days. Hopeful she will have them back on Wednesday morning. Patient states daughter was tested on Saturday and received results this morning that her testing was negative. Patient states she was tested because there are 2 people at her work who tested positive. Patient states she hasn't been around them in about 1.5 weeks, but her employer recommended testing. States she was always masked and only saw them in passing changing shifts. Not currently having any symptoms of Covid. RN advised would keep appointment as scheduled, but patient should call Wednesday morning to give update if results back or not to reschedule. Patient agreeable. Patient's appointment not until 1600 on Wednesday. RN advised would review with Debbi and return call if any additional recommendations. Patient agreeable.   Routing to provider to review.

## 2019-07-04 NOTE — Telephone Encounter (Signed)
Agree with plan 

## 2019-07-05 NOTE — Telephone Encounter (Signed)
Spoke with patient. Patient will call tomorrow am to update if she has results of Covid testing or not. If not, advised patient would reschedule appointment. Patient agreeable.   Will close encounter.

## 2019-07-06 ENCOUNTER — Ambulatory Visit (INDEPENDENT_AMBULATORY_CARE_PROVIDER_SITE_OTHER): Payer: Managed Care, Other (non HMO) | Admitting: Certified Nurse Midwife

## 2019-07-06 ENCOUNTER — Encounter: Payer: Self-pay | Admitting: Certified Nurse Midwife

## 2019-07-06 ENCOUNTER — Other Ambulatory Visit: Payer: Self-pay

## 2019-07-06 VITALS — BP 100/64 | HR 62 | Temp 97.8°F | Resp 18 | Wt 146.0 lb

## 2019-07-06 DIAGNOSIS — N61 Mastitis without abscess: Secondary | ICD-10-CM | POA: Diagnosis not present

## 2019-07-06 NOTE — Progress Notes (Signed)
   Subjective:   58 y.o. Married Caucasian female presents for follow right breast mastitis. Noted one week ago. Taking Keflex as prescribed. Has not been using warm soaks to area. Denies fever, chills or increase redness in area. Does not feel enlarged lymph node now. "Feeling better" but not resolved. Trying to wear loose bra for comfort. No other concerns today.  Review of Systems Pertinent items are noted in HPI. Pertinent items noted in HPI and remainder of comprehensive ROS otherwise negative.  Objective:   General appearance: alert, cooperative and appears stated age  Breasts: Right breast area of redness on outer edge at 7-8 o'clock softer with slight firmness noted now. No redness or warmth to area noted now. No nipple discharge or rash on breast.Slight tenderness to touch only. Axillary lymph nodes on right: no enlargement or tenderness noted  Assessment:   ASSESSMENT:Right breast mastitis responding well to Keflex. Plan:   PLAN: Discussed finding and need to do warm compresses to breast twice daily and continue Rx as instructed. Warning signs reviewed and will need to be seen if occurs.questions addressed.  Rv one week or prn if change

## 2019-07-06 NOTE — Patient Instructions (Signed)
Mastitis  Mastitis is irritation and swelling (inflammation) in an area of the breast. It is often caused by an infection that occurs when germs (bacteria) enter the skin. This most often happens to breastfeeding mothers, but it can happen to other women too as well as some men. Follow these instructions at home: Medicines  Take over-the-counter and prescription medicines only as told by your doctor.  If you were prescribed an antibiotic medicine, take it as told by your doctor. Do not stop taking it even if you start to feel better. General instructions  Do not wear a tight or underwire bra. Wear a soft support bra.  Drink more fluids, especially if you have a fever.  Get plenty of rest. If you are breastfeeding:   Keep emptying your breasts by breastfeeding or by using a breast pump.  Keep your nipples clean and dry.  During breastfeeding, empty the first breast before going to the other breast. Use a breast pump if your baby is not emptying your breasts.  Massage your breasts during feeding or pumping as told by your doctor.  If told, put moist heat on the affected area of your breast right before breastfeeding or pumping. Use the heat source that your doctor tells you to use.  If told, put ice on the affected area of your breast right after breastfeeding or pumping: ? Put ice in a plastic bag. ? Place a towel between your skin and the bag. ? Leave the ice on for 20 minutes.  If you go back to work, pump your breasts while at work.  Avoid letting your breasts get overly filled with milk (engorged). Contact a doctor if:  You have pus-like fluid leaking from your breast.  You have a fever.  Your symptoms do not get better within 2 days. Get help right away if:  Your pain and swelling are getting worse.  Your pain is not helped by medicine.  You have a red line going from your breast toward your armpit. Summary  Mastitis is irritation and swelling in an area of  the breast.  If you were prescribed an antibiotic medicine, do not stop taking it even if you start to feel better.  Drink more fluids and get plenty of rest.  Contact a doctor if your symptoms do not get better within 2 days. This information is not intended to replace advice given to you by your health care provider. Make sure you discuss any questions you have with your health care provider. Document Released: 09/03/2009 Document Revised: 08/28/2017 Document Reviewed: 10/07/2016 Elsevier Patient Education  2020 Elsevier Inc.  

## 2019-07-06 NOTE — Telephone Encounter (Signed)
Patient returned call and stated that her Covid test results were negative. Patient denies any covid symptoms today. RN advised okay to keep appointment as scheduled at 1600 today. Patient agreeable.   Routing to provider and will close encounter.

## 2019-07-12 ENCOUNTER — Other Ambulatory Visit: Payer: Self-pay

## 2019-07-13 ENCOUNTER — Other Ambulatory Visit: Payer: Self-pay

## 2019-07-13 ENCOUNTER — Encounter: Payer: Self-pay | Admitting: Certified Nurse Midwife

## 2019-07-13 ENCOUNTER — Ambulatory Visit (INDEPENDENT_AMBULATORY_CARE_PROVIDER_SITE_OTHER): Payer: Managed Care, Other (non HMO) | Admitting: Certified Nurse Midwife

## 2019-07-13 DIAGNOSIS — N61 Mastitis without abscess: Secondary | ICD-10-CM | POA: Insufficient documentation

## 2019-07-13 HISTORY — DX: Mastitis without abscess: N61.0

## 2019-07-13 NOTE — Progress Notes (Signed)
Review of Systems  Constitutional: Negative.   HENT: Negative.   Eyes: Negative.   Respiratory: Negative.   Cardiovascular: Negative.   Gastrointestinal: Negative.   Genitourinary: Negative.   Musculoskeletal: Negative.   Skin: Negative.   Neurological: Negative.   Endo/Heme/Allergies: Negative.   Psychiatric/Behavioral: Negative.     58 y.o. Married Caucasian female [phone] here for follow up of mastitis of right breast treated with Keflex initiated on  07/01/2019.Completed all medication as directed.  Denies any symptoms of redness, tenderness or firm area now. Using cream based soap to wash with now. "Feels normal". No other health issues today.    O: Healthy WD,WN female Affect: normal, orientation x 3 Skin:warm and dry Right Breast: no masses, no redness or discoloration of skin, no nipple discharge or firmness is area of infection, soft to touch now. Left: no masses, skin change, nipple discharge, or firmness Axillary bilateral: no enlarged lymph nodes or tenderness.   A:Right breast mastitis resolved Normal breast exam   P: Discussed findings of right breast infection now resolved. Discussed using cream based soap and importance of SBE monthly. Patient aware to call/come if occurs again. Questions addressed.  Rv prn

## 2019-09-14 NOTE — Progress Notes (Signed)
58 y.o. [id number] Married Caucasian female here for annual exam.  Would like to know about the pneumonia vaccine.   Had mastitis of the right breast treated in October.  Her imaging was normal.  She was tx with Keflex.   Does not need refill of vaginal estrogen cream.   Working at [employer].  Saw her PCP.  Has elevated cholesterol, which is being monitored.   Patient is adopted and does not know her family history.   PCP:   Merri Brunette Dr. Cleophas Dunker  Patient's last menstrual period was 09/29/2005.           Sexually active: Yes.    The current method of family planning is post menopausal status.    Exercising: Yes.    running 3-4 days a week Smoker:  no  Health Maintenance: Pap:  09-10-18 AGUS:Neg HR HPV,09-04-17 AGUS:Neg HR HPV, 03-13-17 Neg:Neg HR HPV, 07-30-16 ASC-H:Neg HR HPV History of abnormal Pap:  Yes,pap12-13-19 AGUS:Neg HR HPV with conization of cervix 11-02-18 showing CIN II/III with margins negative. 09-04-17 AGUS:Neg HR HPV with colpo showing LGSIL and neg.ECC.Pap 07/30/16 - ASCUS-H, neg HR HPV. Colposcopy 09/03/16 -Satisfactory.ECC showed LGSIL squamous dysplasia and benign endocervical cells, bx at 2:00 showed LGSIL. P16 staining negative in both specimens.  EMB - atrophic endometrium.  Pap 03/13/17 - WNL, negative HR HPV--Hx cryotherapy years ago MMG: 07-01-19 Diag.Rt.Br.& Rt.Br.US/Neg/density C/BiRads1/12-15-18 Neg/BiRads1 Colonoscopy:  2014 normal;next 2024 BMD: 07/2017  Result :Osteoporosis TDaP: 11-30-13 Gardasil:   no HIV: 2012 Neg per patient Hep C: 07/2017 Neg with PCP Screening Labs:  Hb today: PCP, Urine today: none collected. Shingrix:  Completed.  Flu vaccine:  Completed.    reports that she has never smoked. She has never used smokeless tobacco. She reports that she does not drink alcohol or use drugs.  Past Medical History:  Diagnosis Date  . Acute mastitis of right breast 07/13/2019  . Cervical dysplasia, mild 2017   Cotesting due in June 2018.    Marland Kitchen Elevated blood sugar   . 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 Osteoporosis     Past Surgical History:  Procedure Laterality Date  . CERVICAL CONIZATION W/BX N/A 11/02/2018   Procedure: CONIZATION CERVIX WITH BIOPSY;  CIN II/III - Surgeon: Patton Salles, MD;  Location: [facility];  Service: Gynecology;  Laterality: N/A;  . DILATION AND CURETTAGE OF UTERUS N/A 11/02/2018   Procedure: DILATATION AND CURETTAGE Fractional;  Surgeon: Patton Salles, MD;  Location: [facility];  Service: Gynecology;  Laterality: N/A;  Fractional D&C  . GANGLION CYST EXCISION     LEFT  . GYNECOLOGIC CRYOSURGERY  1987   CIN I  . KNEE ARTHROSCOPY     1980  . MANDIBLE SURGERY    . ORIF CALCANEOUS FRACTURE Left 05/22/2015   Procedure: OPEN REDUCTION INTERNAL FIXATION (ORIF) CALCANEOUS FRACTURE;  Surgeon: Teryl Lucy, MD;  Location: [facility];  Service: Orthopedics;  Laterality: Left;  . ROTATOR CUFF REPAIR Left   . SHOULDER ARTHROSCOPY Right 08/2009  . TUBAL LIGATION      Current Outpatient Medications  Medication Sig Dispense Refill  . acetaminophen (TYLENOL) 500 MG tablet Take 1,000 mg by mouth every 8 (eight) hours as needed for mild pain or moderate pain.    Marland Kitchen conjugated estrogens (PREMARIN) vaginal cream Place 1/2 gram per vagina at bedtime two to three nights per week. 30 g 1  . meloxicam (MOBIC) 15 MG  tablet Take 15 mg by mouth daily.    . Multiple Vitamin (MULTIVITAMIN) capsule Take 1 capsule by mouth daily.    . Omega-3 Fatty Acids (FISH OIL) 1000 MG CAPS 2,000 mg 1 (one) hour before bedtime.    . zoledronic acid (RECLAST) 5 MG/100ML SOLN injection Inject 5 mg into the vein once.    . triamcinolone cream (KENALOG) 0.5 %      No current facility-administered medications for this visit.    Family History  Adopted: Yes    Review of Systems  All other systems reviewed and are negative.   Exam:   BP 136/72   Pulse 71    Temp 98.8 F (37.1 C)   Ht 5' 3.5" (1.613 m)   Wt 147 lb 3.2 oz (66.8 kg)   LMP 09/29/2005   SpO2 94%   BMI 25.67 kg/m     General appearance: alert, cooperative and appears stated age Head: normocephalic, without obvious abnormality, atraumatic Neck: no adenopathy, supple, symmetrical, trachea midline and thyroid normal to inspection and palpation Lungs: clear to auscultation bilaterally Breasts: normal appearance, no masses or tenderness, No nipple retraction or dimpling, No nipple discharge or bleeding, No axillary adenopathy Heart: regular rate and rhythm Abdomen: soft, non-tender; no masses, no organomegaly Extremities: extremities normal, atraumatic, no cyanosis or edema Skin: skin color, texture, turgor normal. No rashes or lesions Lymph nodes: cervical, supraclavicular, and axillary nodes normal. Neurologic: grossly normal  Pelvic: External genitalia:  no lesions              No abnormal inguinal nodes palpated.              Urethra:  normal appearing urethra with no masses, tenderness or lesions              Bartholins and Skenes: normal                 Vagina: normal appearing vagina with normal color and discharge, no lesions              Cervix: no lesions.  Cervix shortened.  Os difficult to see.               Pap taken: Yes.   Bimanual Exam:  Uterus:  normal size, contour, position, consistency, mobility, non-tender              Adnexa: no mass, fullness, tenderness              Rectal exam: Yes.  .  Confirms.              Anus:  normal sphincter tone, no lesions  Chaperone was present for exam.  Assessment:   Well woman visit with normal exam. Osteoporosis.  On Reclast. Status post cervical conization.  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   Plan: Mammogram screening discussed. Self breast awareness reviewed. Pap and HR HPV as above. Guidelines for Calcium, Vitamin D, regular exercise program including cardiovascular and weight bearing exercise. She  will follow up with her PCP for questions about pneumonia vaccine.  Follow up annually and prn.   After visit summary provided.

## 2019-09-16 ENCOUNTER — Other Ambulatory Visit: Payer: Self-pay

## 2019-09-16 ENCOUNTER — Encounter: Payer: Self-pay | Admitting: Obstetrics and Gynecology

## 2019-09-16 ENCOUNTER — Other Ambulatory Visit (HOSPITAL_COMMUNITY)
Admission: RE | Admit: 2019-09-16 | Discharge: 2019-09-16 | Disposition: A | Discharge status: Home / Self Care | Payer: Managed Care, Other (non HMO) | Source: Ambulatory Visit | Attending: Obstetrics and Gynecology | Admitting: Obstetrics and Gynecology

## 2019-09-16 ENCOUNTER — Ambulatory Visit (INDEPENDENT_AMBULATORY_CARE_PROVIDER_SITE_OTHER): Payer: Managed Care, Other (non HMO) | Admitting: Obstetrics and Gynecology

## 2019-09-16 VITALS — BP 136/72 | HR 71 | Temp 98.8°F | Ht 63.5 in | Wt 147.2 lb

## 2019-09-16 DIAGNOSIS — Z01419 Encounter for gynecological examination (general) (routine) without abnormal findings: Secondary | ICD-10-CM | POA: Insufficient documentation

## 2019-09-16 NOTE — Patient Instructions (Signed)

## 2019-09-20 LAB — CYTOLOGY - PAP
Adequacy: ABSENT
Comment: NEGATIVE
Diagnosis: NEGATIVE
High risk HPV: NEGATIVE

## 2019-09-27 ENCOUNTER — Other Ambulatory Visit: Payer: Self-pay

## 2019-09-27 NOTE — Progress Notes (Signed)
GYNECOLOGY  VISIT   HPI: 58 y.o.   Married  Caucasian  female   [phone] with Patient's last menstrual period was 09/29/2005.here for pap smear only.  Patient's pap on 09/16/19 showed cells negative for intraepithelial lesion but no transformation zone cells were seen.  Her HR HPV was negative.   She had a cervical conization and endometrial curettage 11/02/18 for an AGUS pap (negative HR HPV) and a negative colposcopy and negative endometrial biopsy.  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  GYNECOLOGIC HISTORY: Patient's last menstrual period was 09/29/2005. Contraception:  Postmenopausal Menopausal hormone therapy: Premarin cream Last mammogram:  07-01-19 Diag.Rt.Br.& Rt.Br.US/Neg/density C/BiRads1/12-15-18  Last pap smear: 09-10-18 AGUS:Neg HR HPV,09-04-17 AGUS:Neg HR HPV, 03-13-17 Neg:Neg HR HPV, 07-30-16 ASC-H:Neg HR HPV        OB History    Gravida  4   Para  2   Term  2   Preterm      AB  2   Living  2     SAB  2   TAB      Ectopic      Multiple      Live Births                 Patient Active Problem List   Diagnosis Date Noted  . Osteoporosis 07/01/2019  . Calcaneus fracture, left 05/22/2015    Past Medical History:  Diagnosis Date  . Acute mastitis of right breast 07/13/2019  . Cervical dysplasia, mild 2017   Cotesting due in June 2018.   Marland Kitchen Elevated blood sugar   . 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 Osteoporosis     Past Surgical History:  Procedure Laterality Date  . CERVICAL CONIZATION W/BX N/A 11/02/2018   Procedure: CONIZATION CERVIX WITH BIOPSY;  CIN II/III - Surgeon: Patton Salles, MD;  Location: [facility];  Service: Gynecology;  Laterality: N/A;  . DILATION AND CURETTAGE OF UTERUS N/A 11/02/2018   Procedure: DILATATION AND CURETTAGE Fractional;  Surgeon: Patton Salles, MD;  Location: [facility];   Service: Gynecology;  Laterality: N/A;  Fractional D&C  . GANGLION CYST EXCISION     LEFT  . GYNECOLOGIC CRYOSURGERY  1987   CIN I  . KNEE ARTHROSCOPY     1980  . MANDIBLE SURGERY    . ORIF CALCANEOUS FRACTURE Left 05/22/2015   Procedure: OPEN REDUCTION INTERNAL FIXATION (ORIF) CALCANEOUS FRACTURE;  Surgeon: Teryl Lucy, MD;  Location: [facility];  Service: Orthopedics;  Laterality: Left;  . ROTATOR CUFF REPAIR Left   . SHOULDER ARTHROSCOPY Right 08/2009  . TUBAL LIGATION      Current Outpatient Medications  Medication Sig Dispense Refill  . acetaminophen (TYLENOL) 500 MG tablet Take 1,000 mg by mouth every 8 (eight) hours as needed for mild pain or moderate pain.    Marland Kitchen conjugated estrogens (PREMARIN) vaginal cream Place 1/2 gram per vagina at bedtime two to three nights per week. 30 g 1  . meloxicam (MOBIC) 15 MG tablet Take 15 mg by mouth daily.    . Multiple Vitamin (MULTIVITAMIN) capsule Take 1 capsule by mouth daily.    . Omega-3 Fatty Acids (FISH OIL) 1000 MG CAPS 2,000 mg 1 (one) hour before bedtime.    . triamcinolone cream (KENALOG) 0.5 %     . zoledronic acid (RECLAST) 5 MG/100ML SOLN injection Inject 5 mg into  the vein once.     No current facility-administered medications for this visit.     ALLERGIES: Patient has no known allergies.  Family History  Adopted: Yes    Social History   Socioeconomic History  . Marital status: Married    Spouse name: Not on file  . Number of children: Not on file  . Years of education: Not on file  . Highest education level: Not on file  Occupational History  . Not on file  Tobacco Use  . Smoking status: Never Smoker  . Smokeless tobacco: Never Used  Substance and Sexual Activity  . Alcohol use: No    Alcohol/week: 0.0 standard drinks  . Drug use: No  . Sexual activity: Yes    Partners: Male    Birth control/protection: Post-menopausal, Surgical    Comment: Tubal  Other Topics Concern  . Not on file  Social History  Narrative  . Not on file   Social Determinants of Health   Financial Resource Strain:   . Difficulty of Paying Living Expenses: Not on file  Food Insecurity:   . Worried About Programme researcher, broadcasting/film/videounning Out of Food in the Last Year: Not on file  . Ran Out of Food in the Last Year: Not on file  Transportation Needs:   . Lack of Transportation (Medical): Not on file  . Lack of Transportation (Non-Medical): Not on file  Physical Activity:   . Days of Exercise per Week: Not on file  . Minutes of Exercise per Session: Not on file  Stress:   . Feeling of Stress : Not on file  Social Connections:   . Frequency of Communication with Friends and Family: Not on file  . Frequency of Social Gatherings with Friends and Family: Not on file  . Attends Religious Services: Not on file  . Active Member of Clubs or Organizations: Not on file  . Attends BankerClub or Organization Meetings: Not on file  . Marital Status: Not on file  Intimate Partner Violence:   . Fear of Current or Ex-Partner: Not on file  . Emotionally Abused: Not on file  . Physically Abused: Not on file  . Sexually Abused: Not on file    Review of Systems  All other systems reviewed and are negative.   PHYSICAL EXAMINATION:    BP 122/70   Pulse 100   Temp (!) 97.2 F (36.2 C) (Temporal)   Ht 5' 3.5" (1.613 m)   Wt 146 lb 12.8 oz (66.6 kg)   LMP 09/29/2005   BMI 25.60 kg/m     General appearance: alert, cooperative and appears stated age   Pelvic: External genitalia:  no lesions              Urethra:  normal appearing urethra with no masses, tenderness or lesions              Bartholins and Skenes: normal                 Vagina: normal appearing vagina with normal color and discharge, no lesions              Cervix: flush with vaginal cuff.                 Bimanual Exam:  Uterus:  normal size, contour, position, consistency, mobility, non-tender              Adnexa: no mass, fullness, tenderness         Cervical dilation and pap.  Verbal consent for procedure.  Hibiclens prep. Local 1% lidocaine 10 cc paracervical block - lot [id number], exp 4/22.      Scalpel used to nick surface of os.  Os finder used. Pap collected.   Monsel's placed.  No complications.  Minimal EBL.  Chaperone was present for exam.  ASSESSMENT  Hx CIN III.  Status post cervical conization.  Cervical stenosis.   PLAN  Fu pap.  If no endocervical cells, will refer to [place].  I discussed with the patient colposcopy versus cervical conization to evaluate for recurrent dysplasia.   An After Visit Summary was printed and given to the patient.  __15____ minutes face to face time of which over 50% was spent in counseling.

## 2019-09-28 ENCOUNTER — Encounter: Payer: Self-pay | Admitting: Obstetrics and Gynecology

## 2019-09-28 ENCOUNTER — Other Ambulatory Visit (HOSPITAL_COMMUNITY)
Admission: RE | Admit: 2019-09-28 | Discharge: 2019-09-28 | Disposition: A | Discharge status: Home / Self Care | Payer: Managed Care, Other (non HMO) | Source: Ambulatory Visit | Attending: Obstetrics and Gynecology | Admitting: Obstetrics and Gynecology

## 2019-09-28 ENCOUNTER — Ambulatory Visit (INDEPENDENT_AMBULATORY_CARE_PROVIDER_SITE_OTHER): Payer: Managed Care, Other (non HMO) | Admitting: Obstetrics and Gynecology

## 2019-09-28 VITALS — BP 122/70 | HR 100 | Temp 97.2°F | Ht 63.5 in | Wt 146.8 lb

## 2019-09-28 DIAGNOSIS — N882 Stricture and stenosis of cervix uteri: Secondary | ICD-10-CM

## 2019-09-28 DIAGNOSIS — Z124 Encounter for screening for malignant neoplasm of cervix: Secondary | ICD-10-CM | POA: Insufficient documentation

## 2019-09-28 DIAGNOSIS — D069 Carcinoma in situ of cervix, unspecified: Secondary | ICD-10-CM | POA: Diagnosis not present

## 2019-10-04 LAB — CYTOLOGY - PAP
Adequacy: ABSENT
Diagnosis: NEGATIVE

## 2019-10-05 ENCOUNTER — Encounter: Payer: Self-pay | Admitting: Obstetrics and Gynecology

## 2019-10-05 ENCOUNTER — Telehealth: Payer: Self-pay | Admitting: Obstetrics and Gynecology

## 2019-10-05 DIAGNOSIS — D069 Carcinoma in situ of cervix, unspecified: Secondary | ICD-10-CM

## 2019-10-05 DIAGNOSIS — N882 Stricture and stenosis of cervix uteri: Secondary | ICD-10-CM

## 2019-10-05 NOTE — Telephone Encounter (Signed)
Wehrman, Traci Hoffman "[name]" sent to [facility] Clinical [facility]  Phone Number: [phone]  I saw my pap didn't have any cells again. When will I hear from you to schedule appointment with oncologists?

## 2019-10-06 NOTE — Telephone Encounter (Signed)
Spoke to pt. Pt aware of referral and lab results. Pt agreeable. See lab results from 10/06/2019.  Will route to Dr Edward Jolly for review and will close encounter.   Cc: Rosa, please refer to Dr Adolphus Birchwood. Orders placed.

## 2019-10-12 ENCOUNTER — Telehealth: Payer: Self-pay | Admitting: Obstetrics and Gynecology

## 2019-10-12 NOTE — Telephone Encounter (Signed)
Spoke with patient. Patient is agreeable to returning to the office to see Dr.Silva. Appointment scheduled for 10/13/2019 at 8 am with Dr.Silva. Patient is agreeable to date and time.  Routing to provider and will close encounter.

## 2019-10-12 NOTE — Telephone Encounter (Signed)
Please have patient come in for recheck appointment.   I need to recheck her cervical anatomy to decide if I will proceed with a conization or if GYN ONC will do this.   Thank you,   [facility]

## 2019-10-12 NOTE — Progress Notes (Signed)
GYNECOLOGY  VISIT   HPI: 59 y.o.   Married  Caucasian  female   [phone] with Patient's last menstrual period was 09/29/2005.here for follow up to evaluate cervical anatomy.    The patient has a history of CIN III that was detected by cervical conization in February, 2020 following a pap showing AGUS and negative HR HPV on 09/10/18.  Her colposcopic biopsy showed inflammation of the cervix.  Her ECC was benign.  Her EMB showed atrophy.   Her follow up pap on 09/16/19 was negative for intraepithelial lesions and her HR HPV was negative.  No transformation zone was seen on the pap.   She has cervical stenosis and her cervix is flush with the vaginal cuff.   I saw her back in the office on 09/28/19 and did a paracervical block and attempted to open the cervical os with a scalpel to be able to get some endocervical cells.  The canal seemed to be a blind passage.  I did a pap.  The pap returned again negative for intraepithelial lesion and absent transformation zone.   She has not used her vaginal estrogen cream in 2 - 3 weeks.   GYNECOLOGIC HISTORY: Patient's last menstrual period was 09/29/2005. Contraception: PMP Menopausal hormone therapy:  Premarin cream Last mammogram: 07-01-19 Diag.Rt.Br.& Rt.Br.US/Neg/density C/BiRads1/12-15-18  Last pap smear: 09-16-19 Neg--no endocx cells:Neg HR HPV,09-10-18 AGUS:Neg HR HPV,09-04-17 AGUS:Neg HR HPV, 03-13-17 Neg:Neg HR HPV, 07-30-16 ASC-H:Neg HR HPV                OB History    Gravida  4   Para  2   Term  2   Preterm      AB  2   Living  2     SAB  2   TAB      Ectopic      Multiple      Live Births                 Patient Active Problem List   Diagnosis Date Noted  . Osteoporosis 07/01/2019  . Calcaneus fracture, left 05/22/2015    Past Medical History:  Diagnosis Date  . Acute mastitis of right breast 07/13/2019  . Cervical dysplasia, mild 2017   Cotesting due in June 2018.   Marland Kitchen Elevated blood sugar   . Narcotic  abuse (HCC)    --Percocet and Vicodin abuse  . Osteoporosis     Past Surgical History:  Procedure Laterality Date  . CERVICAL CONIZATION W/BX N/A 11/02/2018   Procedure: CONIZATION CERVIX WITH BIOPSY;  CIN II/III - Surgeon: Nunzio Cobbs, MD;  Location: [facility];  Service: Gynecology;  Laterality: N/A;  . DILATION AND CURETTAGE OF UTERUS N/A 11/02/2018   Procedure: DILATATION AND CURETTAGE Fractional;  Surgeon: Nunzio Cobbs, MD;  Location: [facility];  Service: Gynecology;  Laterality: N/A;  Fractional D&C  . GANGLION CYST EXCISION     LEFT  . GYNECOLOGIC CRYOSURGERY  1987   CIN I  . KNEE ARTHROSCOPY     1980  . MANDIBLE SURGERY    . ORIF CALCANEOUS FRACTURE Left 05/22/2015   Procedure: OPEN REDUCTION INTERNAL FIXATION (ORIF) CALCANEOUS FRACTURE;  Surgeon: Marchia Bond, MD;  Location: [place];  Service: Orthopedics;  Laterality: Left;  . ROTATOR CUFF REPAIR Left   . SHOULDER ARTHROSCOPY Right 08/2009  . TUBAL LIGATION      Current Outpatient Medications  Medication Sig Dispense Refill  .  acetaminophen (TYLENOL) 500 MG tablet Take 1,000 mg by mouth every 8 (eight) hours as needed for mild pain or moderate pain.    Marland Kitchen conjugated estrogens (PREMARIN) vaginal cream Place 1/2 gram per vagina at bedtime two to three nights per week. 30 g 1  . meloxicam (MOBIC) 15 MG tablet Take 15 mg by mouth daily.    . Multiple Vitamin (MULTIVITAMIN) capsule Take 1 capsule by mouth daily.    . Omega-3 Fatty Acids (FISH OIL) 1000 MG CAPS 2,000 mg 1 (one) hour before bedtime.    . triamcinolone cream (KENALOG) 0.5 %     . zoledronic acid (RECLAST) 5 MG/100ML SOLN injection Inject 5 mg into the vein once.     No current facility-administered medications for this visit.     ALLERGIES: Patient has no known allergies.  Family History  Adopted: Yes    Social History   Socioeconomic History  . Marital status: Married    Spouse name: Not on file   . Number of children: Not on file  . Years of education: Not on file  . Highest education level: Not on file  Occupational History  . Not on file  Tobacco Use  . Smoking status: Never Smoker  . Smokeless tobacco: Never Used  Substance and Sexual Activity  . Alcohol use: No    Alcohol/week: 0.0 standard drinks  . Drug use: No  . Sexual activity: Yes    Partners: Male    Birth control/protection: Post-menopausal, Surgical    Comment: Tubal  Other Topics Concern  . Not on file  Social History Narrative  . Not on file   Social Determinants of Health   Financial Resource Strain:   . Difficulty of Paying Living Expenses: Not on file  Food Insecurity:   . Worried About Programme researcher, broadcasting/film/video in the Last Year: Not on file  . Ran Out of Food in the Last Year: Not on file  Transportation Needs:   . Lack of Transportation (Medical): Not on file  . Lack of Transportation (Non-Medical): Not on file  Physical Activity:   . Days of Exercise per Week: Not on file  . Minutes of Exercise per Session: Not on file  Stress:   . Feeling of Stress : Not on file  Social Connections:   . Frequency of Communication with Friends and Family: Not on file  . Frequency of Social Gatherings with Friends and Family: Not on file  . Attends Religious Services: Not on file  . Active Member of Clubs or Organizations: Not on file  . Attends Banker Meetings: Not on file  . Marital Status: Not on file  Intimate Partner Violence:   . Fear of Current or Ex-Partner: Not on file  . Emotionally Abused: Not on file  . Physically Abused: Not on file  . Sexually Abused: Not on file    Review of Systems  All other systems reviewed and are negative.   PHYSICAL EXAMINATION:    BP 126/78   Pulse 76   Temp (!) 96.3 F (35.7 C) (Temporal)   Ht 5' 3.5" (1.613 m)   Wt 147 lb (66.7 kg)   LMP 09/29/2005   BMI 25.63 kg/m     General appearance: alert, cooperative and appears stated age   Pelvic:  External genitalia:  no lesions              Urethra:  normal appearing urethra with no masses, tenderness or lesions  Bartholins and Skenes: normal                 Vagina: normal appearing vagina with normal color and discharge, no lesions              Cervix: flush with vaginal  No os visualized.  Site of opening the mucosa visible and bleeds with palpation with a Q tip.                 Bimanual Exam:  Uterus:  normal size, contour, position, consistency, mobility, non-tender              Adnexa: no mass, fullness, tenderness               Chaperone was present for exam:  Claudette Laws.  ASSESSMENT  Hx CIN III.  Cervical stenosis.  Cervix flush with the vagina.   PLAN  Will need to refer to GYN ONC for further evaluation and treatment.  I am reaching out personally to Dr. Andrey Farmer.  She understands this may involve doing another conization of the cervix.  Wednesdays are not good days for patient to have an appointment.  I encouraged her to use vaginal estrogen cream once the bleeding has stopped. She will use 1/2 gram nightly for 2 weeks and then use three times weekly.  She knows to stop this several days prior to her GYN ONC appointment.  Questions invited and answered.   An After Visit Summary was printed and given to the patient.  ___15___ minutes face to face time of which over 50% was spent in counseling.

## 2019-10-13 ENCOUNTER — Ambulatory Visit (INDEPENDENT_AMBULATORY_CARE_PROVIDER_SITE_OTHER): Payer: Managed Care, Other (non HMO) | Admitting: Obstetrics and Gynecology

## 2019-10-13 ENCOUNTER — Encounter: Payer: Self-pay | Admitting: Obstetrics and Gynecology

## 2019-10-13 ENCOUNTER — Other Ambulatory Visit: Payer: Self-pay

## 2019-10-13 VITALS — BP 126/78 | HR 76 | Temp 96.3°F | Ht 63.5 in | Wt 147.0 lb

## 2019-10-13 DIAGNOSIS — D069 Carcinoma in situ of cervix, unspecified: Secondary | ICD-10-CM | POA: Diagnosis not present

## 2019-10-13 DIAGNOSIS — N882 Stricture and stenosis of cervix uteri: Secondary | ICD-10-CM

## 2019-10-18 ENCOUNTER — Telehealth: Payer: Self-pay | Admitting: Obstetrics and Gynecology

## 2019-10-18 DIAGNOSIS — D069 Carcinoma in situ of cervix, unspecified: Secondary | ICD-10-CM

## 2019-10-18 DIAGNOSIS — N882 Stricture and stenosis of cervix uteri: Secondary | ICD-10-CM

## 2019-10-18 NOTE — Telephone Encounter (Signed)
Please let patient know that Dr. Andrey Farmer will perform consultation for her.   This will count toward her deductible.   She can address any financial questions through Dr. Oliver Hum office.

## 2019-10-18 NOTE — Telephone Encounter (Signed)
Please formally place the referral.   Thank you.

## 2019-10-18 NOTE — Telephone Encounter (Signed)
I have spoken with the patient who is agreeable and verbalizes understanding.   Will I need to place a new referral for the patient?

## 2019-10-18 NOTE — Telephone Encounter (Signed)
Referral placed to Dr.Rossi's office for scheduling. Note placed that this was approved by Dr.Rossi.  Routing to provider and will close encounter.

## 2019-10-19 ENCOUNTER — Telehealth: Payer: Self-pay | Admitting: *Deleted

## 2019-10-19 NOTE — Telephone Encounter (Signed)
Called the patient and scheduled a new patient appt for 2/19

## 2019-11-18 ENCOUNTER — Encounter: Payer: Self-pay | Admitting: Gynecologic Oncology

## 2019-11-18 ENCOUNTER — Inpatient Hospital Stay: Payer: Managed Care, Other (non HMO) | Attending: Gynecologic Oncology | Admitting: Gynecologic Oncology

## 2019-11-18 ENCOUNTER — Other Ambulatory Visit: Payer: Self-pay

## 2019-11-18 VITALS — BP 126/78 | HR 77 | Temp 98.2°F | Resp 18 | Ht 63.0 in | Wt 145.5 lb

## 2019-11-18 DIAGNOSIS — R87616 Satisfactory cervical smear but lacking transformation zone: Secondary | ICD-10-CM | POA: Diagnosis not present

## 2019-11-18 DIAGNOSIS — M810 Age-related osteoporosis without current pathological fracture: Secondary | ICD-10-CM | POA: Insufficient documentation

## 2019-11-18 DIAGNOSIS — A630 Anogenital (venereal) warts: Secondary | ICD-10-CM | POA: Insufficient documentation

## 2019-11-18 DIAGNOSIS — Z791 Long term (current) use of non-steroidal anti-inflammatories (NSAID): Secondary | ICD-10-CM | POA: Insufficient documentation

## 2019-11-18 NOTE — Progress Notes (Signed)
Consult Note: Gyn-Onc  Consult was requested by Dr. Quincy Simmonds for the evaluation of Traci Hoffman 59 y.o. female  CC:  Chief Complaint  Patient presents with  . transformation zone absent on pap test    normal cytology, HPV negative    Assessment/Plan:  Traci Hoffman  is a 59 y.o.  year old with a history of CIN III and now with a pap with normal cytology, negative high risk HPV and absent transformation zone.  Due to cervical stenosis and a cervix flush with the vagina an ECC was unable to sample additional cells.  I reviewed the [place] CP guidelines with the patient and earlier today with her referring provider.  We discussed that the recommendation for an absent transformation zone in the presence of normal squamous cytology and negative high-risk HPV cotesting is for resumption of routine guidelines screening.  I suggested that if there was some anxiety this could be performed sooner than the anticipated 5-year.  And could be performed annually.  However surgical procedure to evaluate the endocervical canal is not recommended as part of the guidelines in the absence of concerning exam findings or symptoms.  I explained that I recognize that her dysplastic lesion in the past had not been associated with HPV positivity, however it is possible that her HPV infection was remote to the development of dysplasia.  Her cervix is flush with the vagina and a repeat excisional procedure in the future is unlikely be possible.  Therefore if in the future she develops an abnormal Pap she may require a diagnostic hysterectomy as a LEEP or cone may not be technically possible.  However I do not recommend a prophylactic hysterectomy at this time as is not medically indicated in the setting of a normal Pap negative high-risk HPV.  Additionally I explained it does not obviate her need for regular vaginal surveillance, and that she can still develop vaginal dysplasia and vaginal cancer after hysterectomy.   Therefore such a procedure would not allow the patient to avoid malignant risk.  I further explained that it is much more technically difficult to perform surveillance of the vaginal fornices than it is when the cervix is present as hysterectomy substantially alters the anatomy of the vagina making adequate visualization of all fornices more difficult.  She will follow-up with Dr. Quincy Simmonds for ongoing surveillance.  She was provided a copy of the ACC P guidelines.   HPI: Traci Hoffman is a 59 year old P2 who was seen in consultation at the request of Dr. Quincy Simmonds for a Pap that was normal with squamous cytology but with an absent transformation zone.  This was performed on September 28, 2019.  HPV cotesting from the same sample was negative for high-risk HPV.  The patient had a history of a agonist Pap in December 2019 also associated with negative high-risk HPV testing.  This ASCUS Pap was worked up and treated with a cold knife conization of the cervix performed on November 02, 2018.  Final pathology from that specimen revealed CIN 2-3 with the margins uninvolved by dysplasia.  The endocervical curettage which was benign.  Endometrial curettage was also benign.  She underwent follow-up Pap testing December 2020 with the normal cytology but absent transformation zone due to an atrophied cervix and endocervical canal.  Her provider attempted to perform an ECC in the office but this was unsuccessful.  She was referred for consultation and evaluation with concern for an inadequate evaluation of her transformation zone and a  fear of occult malignancy.  The patient reported no significant history of immunosuppression including HIV transplant medication or autoimmune diseases.  She is a non-smoker.  She has had 2 prior vaginal births.  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  Current Meds:  Outpatient Encounter Medications as of 11/18/2019  Medication Sig  . acetaminophen  (TYLENOL) 500 MG tablet Take 1,000 mg by mouth every 8 (eight) hours as needed for mild pain or moderate pain.  Marland Kitchen conjugated estrogens (PREMARIN) vaginal cream Place 1/2 gram per vagina at bedtime two to three nights per week.  . meloxicam (MOBIC) 15 MG tablet Take 15 mg by mouth as needed.   . Multiple Vitamin (MULTIVITAMIN) capsule Take 1 capsule by mouth daily.  . Omega-3 Fatty Acids (FISH OIL) 1000 MG CAPS 2,000 mg 1 (one) hour before bedtime.  . zoledronic acid (RECLAST) 5 MG/100ML SOLN injection Inject 5 mg into the vein once. Once yearly  . [DISCONTINUED] triamcinolone cream (KENALOG) 0.5 %    No facility-administered encounter medications on file as of 11/18/2019.    Allergy: No Known Allergies  Social Hx:   Social History   Socioeconomic History  . Marital status: Married    Spouse name: Not on file  . Number of children: Not on file  . Years of education: Not on file  . Highest education level: Not on file  Occupational History  . Not on file  Tobacco Use  . Smoking status: Never Smoker  . Smokeless tobacco: Never Used  Substance and Sexual Activity  . Alcohol use: No    Alcohol/week: 0.0 standard drinks  . Drug use: No  . Sexual activity: Yes    Partners: Male    Birth control/protection: Post-menopausal, Surgical    Comment: Tubal  Other Topics Concern  . Not on file  Social History Narrative  . Not on file   Social Determinants of Health   Financial Resource Strain:   . Difficulty of Paying Living Expenses: Not on file  Food Insecurity:   . Worried About Programme researcher, broadcasting/film/video in the Last Year: Not on file  . Ran Out of Food in the Last Year: Not on file  Transportation Needs:   . Lack of Transportation (Medical): Not on file  . Lack of Transportation (Non-Medical): Not on file  Physical Activity:   . Days of Exercise per Week: Not on file  . Minutes of Exercise per Session: Not on file  Stress:   . Feeling of Stress : Not on file  Social Connections:    . Frequency of Communication with Friends and Family: Not on file  . Frequency of Social Gatherings with Friends and Family: Not on file  . Attends Religious Services: Not on file  . Active Member of Clubs or Organizations: Not on file  . Attends Banker Meetings: Not on file  . Marital Status: Not on file  Intimate Partner Violence:   . Fear of Current or Ex-Partner: Not on file  . Emotionally Abused: Not on file  . Physically Abused: Not on file  . Sexually Abused: Not on file    Past Surgical Hx:  Past Surgical History:  Procedure Laterality Date  . CERVICAL CONIZATION W/BX N/A 11/02/2018   Procedure: CONIZATION CERVIX WITH BIOPSY;  CIN II/III - Surgeon: Patton Salles, MD;  Location: [facility];  Service: Gynecology;  Laterality: N/A;  . DILATION AND CURETTAGE OF UTERUS N/A  11/02/2018   Procedure: DILATATION AND CURETTAGE Fractional;  Surgeon: Patton Salles, MD;  Location: [facility];  Service: Gynecology;  Laterality: N/A;  Fractional D&C  . GANGLION CYST EXCISION     LEFT  . GYNECOLOGIC CRYOSURGERY  1987   CIN I  . KNEE ARTHROSCOPY     1980  . MANDIBLE SURGERY    . ORIF CALCANEOUS FRACTURE Left 05/22/2015   Procedure: OPEN REDUCTION INTERNAL FIXATION (ORIF) CALCANEOUS FRACTURE;  Surgeon: Teryl Lucy, MD;  Location: [facility];  Service: Orthopedics;  Laterality: Left;  . ROTATOR CUFF REPAIR Left   . SHOULDER ARTHROSCOPY Right 08/2009  . TUBAL LIGATION      Past Medical Hx:  Past Medical History:  Diagnosis Date  . Acute mastitis of right breast 07/13/2019  . Cervical dysplasia, mild 2017   Cotesting due in June 2018.   Marland Kitchen Elevated blood sugar   . 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 Osteoporosis     Past Gynecological History:  See HPI Patient's last menstrual period was 09/29/2005.  Family Hx:  Family History  Adopted: Yes    Review of Systems:  Constitutional  Feels well,    ENT Normal appearing ears and nares bilaterally Skin/Breast  No rash, sores, jaundice, itching, dryness Cardiovascular  No chest pain, shortness of breath, or edema  Pulmonary  No cough or wheeze.  Gastro Intestinal  No nausea, vomitting, or diarrhoea. No bright red blood per rectum, no abdominal pain, change in bowel movement, or constipation.  Genito Urinary  No frequency, urgency, dysuria, no bleeding Musculo Skeletal  No myalgia, arthralgia, joint swelling or pain  Neurologic  No weakness, numbness, change in gait,  Psychology  No depression, anxiety, insomnia.   Vitals:  Blood pressure 126/78, pulse 77, temperature 98.2 F (36.8 C), resp. rate 18, height 5\' 3"  (1.6 m), weight 145 lb 8 oz (66 kg), last menstrual period 09/29/2005, SpO2 100 %.  Physical Exam: WD in NAD Neck  Supple NROM, without any enlargements.  Lymph Node Survey No cervical supraclavicular or inguinal adenopathy Cardiovascular  Pulse normal rate, regularity and rhythm. S1 and S2 normal.  Lungs  Clear to auscultation bilateraly, without wheezes/crackles/rhonchi. Good air movement.  Skin  No rash/lesions/breakdown  Psychiatry  Alert and oriented to person, place, and time  Abdomen  Normoactive bowel sounds, abdomen soft, non-tender and thin without evidence of hernia.  Back No CVA tenderness Genito Urinary  Vulva/vagina: Normal external female genitalia.  No lesions. No discharge or bleeding.  Bladder/urethra:  No lesions or masses, well supported bladder  Vagina: grossly normal, atrophic  Cervix: flush with vagina, palpably normal, os is a dimple posteriorally.  Uterus: Small, mobile, no parametrial involvement or nodularity.  Adnexa: no palpable masses. Rectal  deferred Extremities  No bilateral cyanosis, clubbing or edema.   11/27/2005, MD  11/18/2019, 12:40 PM

## 2019-11-18 NOTE — Patient Instructions (Signed)
In accordance with the ASCCP guidelines, Dr Andrey Farmer recommends that you return to see Dr Edward Jolly in December for a repeat pap and HPV co-testing.  Dr Oliver Hum office can be reached at [phone] if you have questions.

## 2019-12-16 ENCOUNTER — Other Ambulatory Visit: Payer: Self-pay | Admitting: Obstetrics and Gynecology

## 2019-12-16 DIAGNOSIS — Z1231 Encounter for screening mammogram for malignant neoplasm of breast: Secondary | ICD-10-CM

## 2019-12-19 ENCOUNTER — Encounter: Payer: Self-pay | Admitting: Certified Nurse Midwife

## 2019-12-23 ENCOUNTER — Other Ambulatory Visit: Payer: Self-pay

## 2019-12-23 ENCOUNTER — Ambulatory Visit
Admission: RE | Admit: 2019-12-23 | Discharge: 2019-12-23 | Disposition: A | Discharge status: Home / Self Care | Payer: Managed Care, Other (non HMO) | Source: Ambulatory Visit

## 2019-12-23 DIAGNOSIS — Z1231 Encounter for screening mammogram for malignant neoplasm of breast: Secondary | ICD-10-CM

## 2020-01-02 ENCOUNTER — Other Ambulatory Visit: Payer: Self-pay | Admitting: Obstetrics and Gynecology

## 2020-01-03 NOTE — Telephone Encounter (Signed)
Medication refill request: Premarin Last AEX:  09/16/19 BS Next AEX: 10/01/20 Last MMG (if hormonal medication request): 12/23/19 BIRADS 1 negative/density d Refill authorized: Please advise; refill pended for #30g w/1 refill if authorized

## 2020-01-27 ENCOUNTER — Other Ambulatory Visit (HOSPITAL_COMMUNITY): Payer: Self-pay | Admitting: *Deleted

## 2020-01-30 ENCOUNTER — Encounter (HOSPITAL_COMMUNITY)
Admission: RE | Admit: 2020-01-30 | Discharge: 2020-01-30 | Disposition: A | Discharge status: Home / Self Care | Payer: Managed Care, Other (non HMO) | Source: Ambulatory Visit | Attending: Sports Medicine | Admitting: Sports Medicine

## 2020-01-30 ENCOUNTER — Other Ambulatory Visit: Payer: Self-pay

## 2020-01-30 DIAGNOSIS — M810 Age-related osteoporosis without current pathological fracture: Secondary | ICD-10-CM | POA: Diagnosis present

## 2020-01-30 MED ORDER — ZOLEDRONIC ACID 5 MG/100ML IV SOLN
INTRAVENOUS | Status: AC
  Administered 2020-01-30: 5 mg via INTRAVENOUS
  Filled 2020-01-30: qty 100

## 2020-01-30 MED ORDER — ZOLEDRONIC ACID 5 MG/100ML IV SOLN: 5.0000 mg | Freq: Once | INTRAVENOUS | Status: AC

## 2020-03-03 IMAGING — MG DIGITAL SCREENING BILATERAL MAMMOGRAM WITH TOMO AND CAD
8 series · 9 of 24 positions shown · non-contrast
Comparison: Previous exam(s).

CLINICAL DATA: Screening.

EXAM:
DIGITAL SCREENING BILATERAL MAMMOGRAM WITH TOMO AND CAD

[L MLO synth-2D]
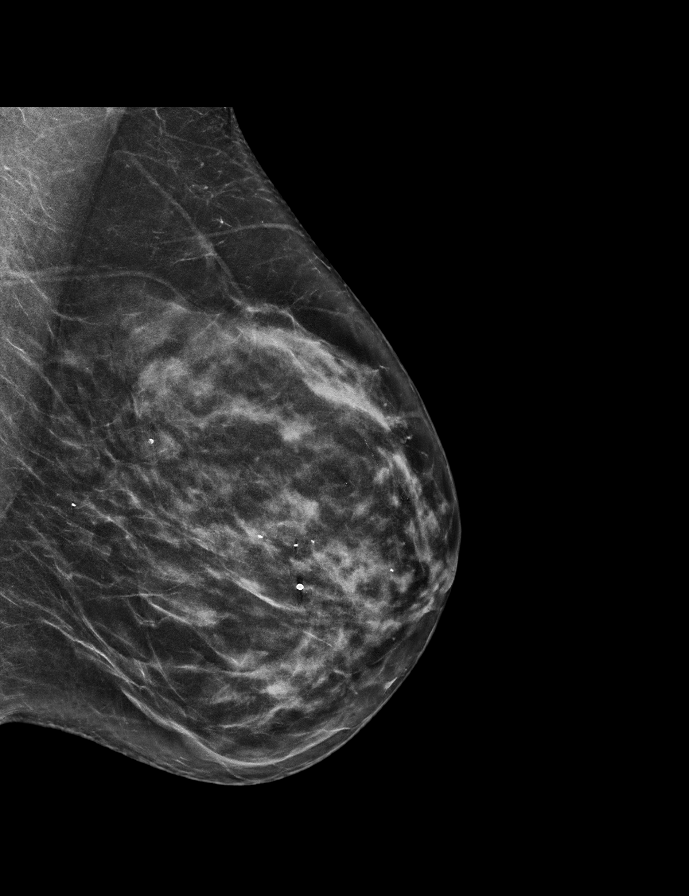

[R MLO synth-2D]
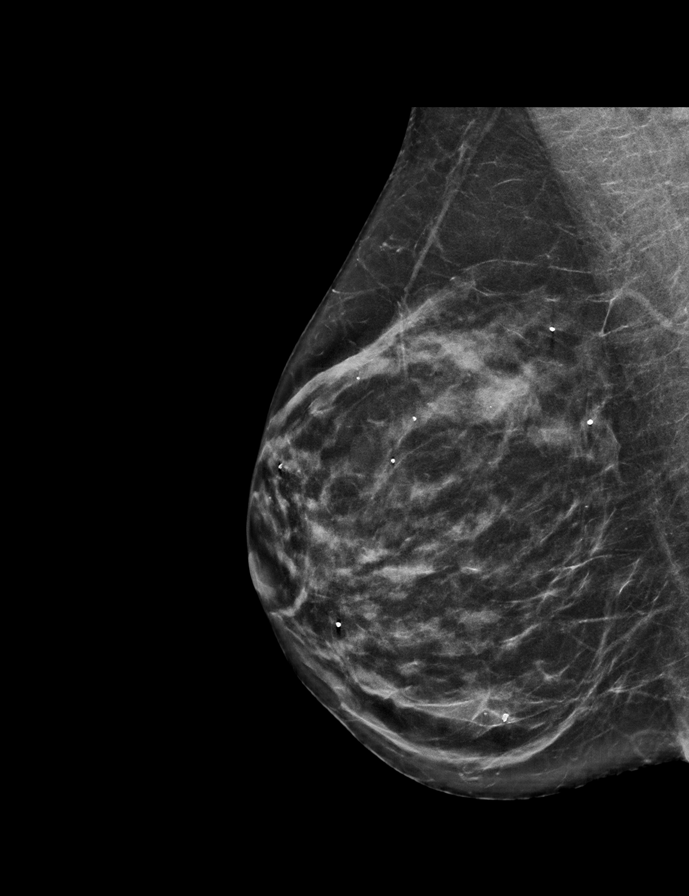

[R CC synth-2D]
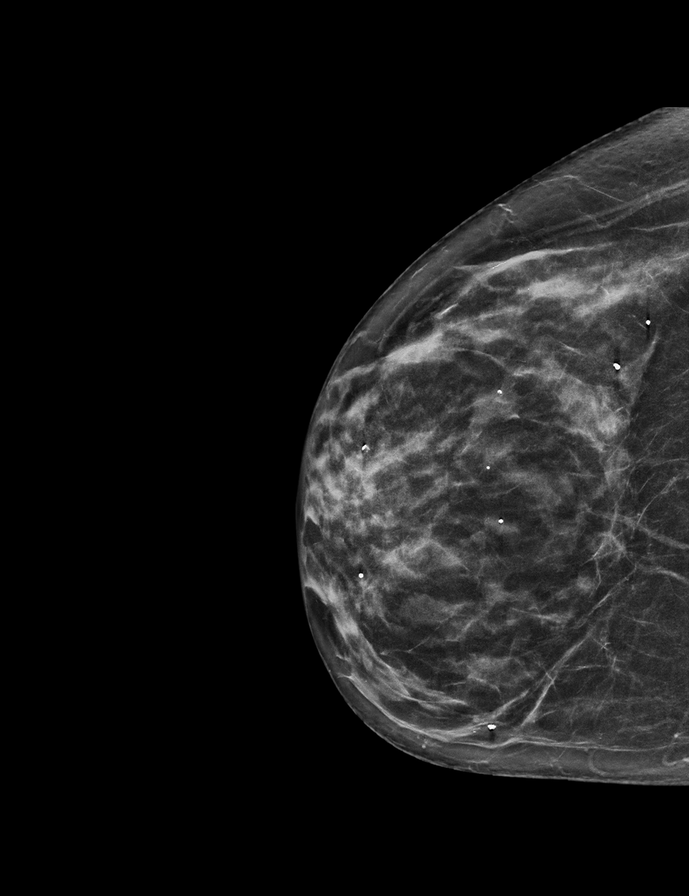

[L CC synth-2D]
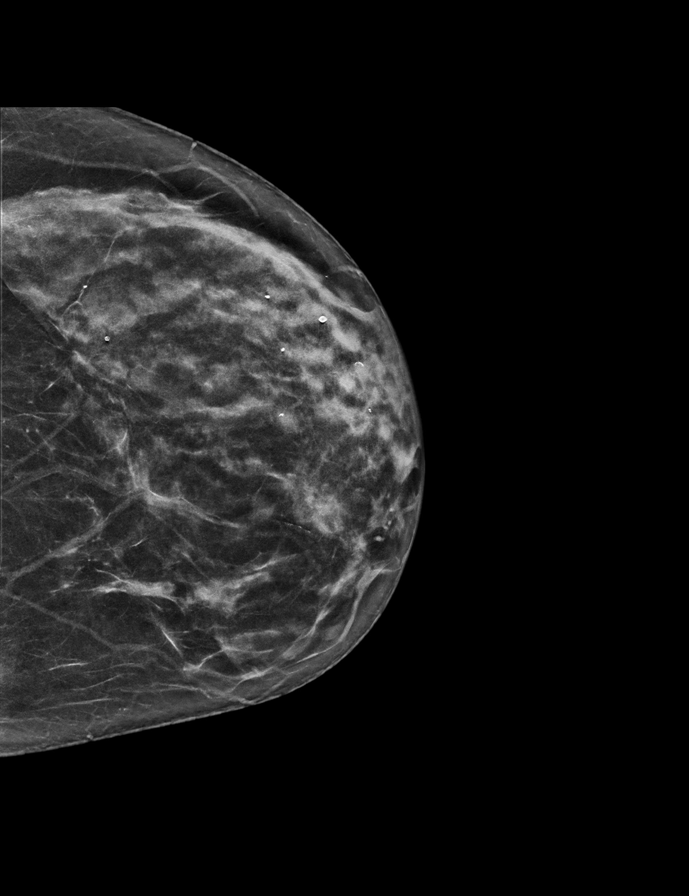

[R MLO tomo · 2 of 62 frames shown]
[frame 21/62]
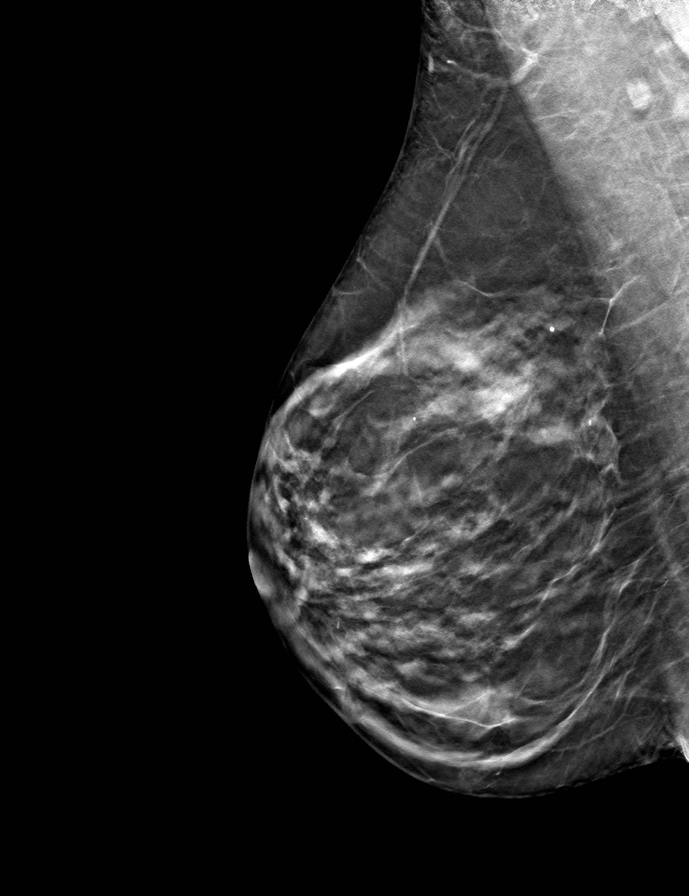
[frame 31/62]
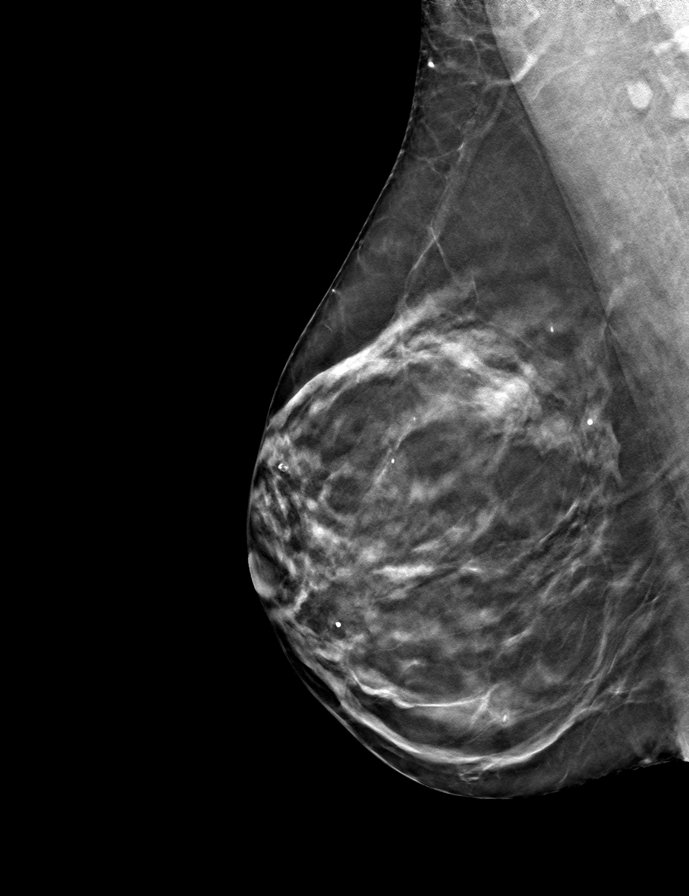

[L CC tomo · tomo slice 29/58.0]
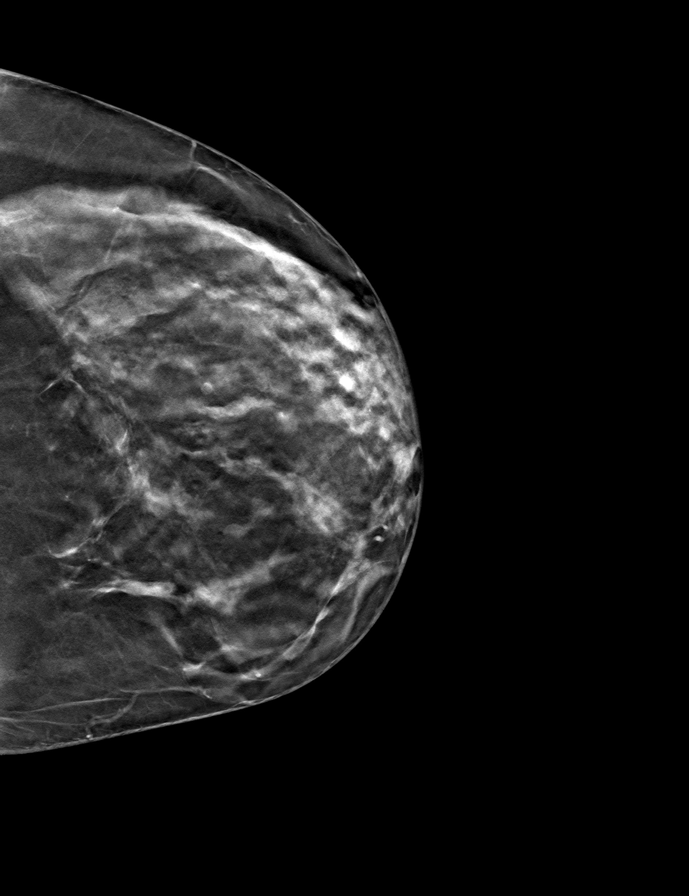

[R CC tomo · tomo slice 31/61.0]
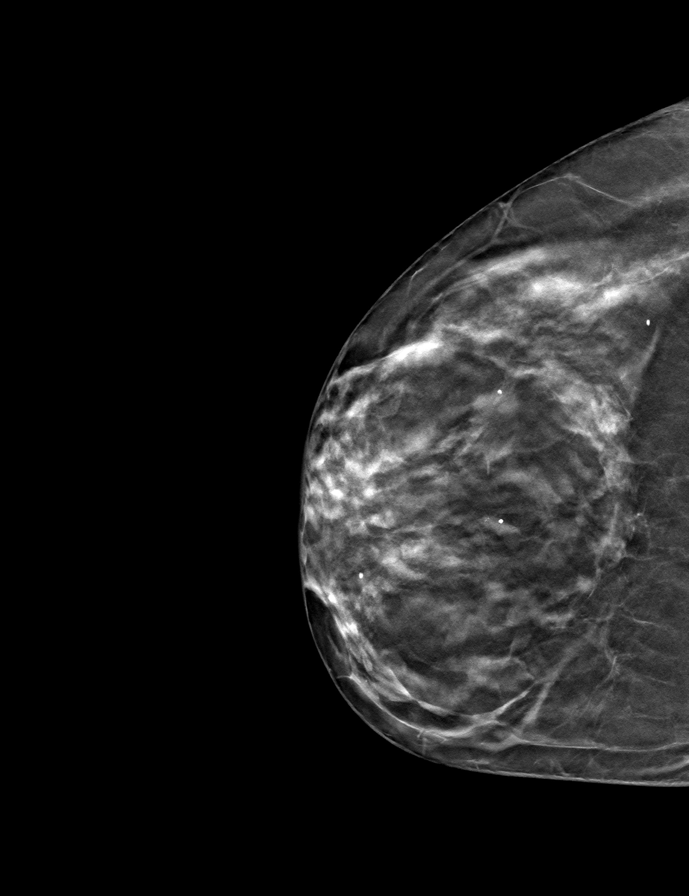

[L MLO tomo · tomo slice 32/63.0]
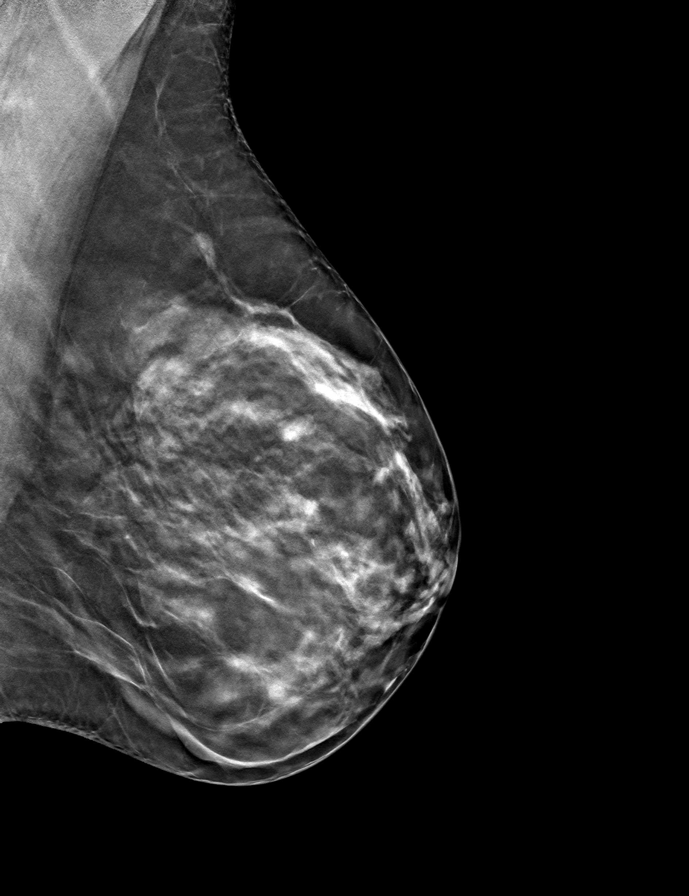

[9 of 24 positions shown; findings below may reference images not displayed]

ACR Breast Density Category c: The breast tissue is heterogeneously
dense, which may obscure small masses.
FINDINGS: There are no findings suspicious for malignancy. Images were
processed with CAD.
IMPRESSION: No mammographic evidence of malignancy. A result letter of this
screening mammogram will be mailed directly to the patient.

RECOMMENDATION:
Screening mammogram in one year. (Code:FT-U-LHB)

BI-RADS CATEGORY  1: Negative.

## 2020-10-01 ENCOUNTER — Ambulatory Visit: Payer: Managed Care, Other (non HMO) | Admitting: Obstetrics and Gynecology

## 2020-11-06 ENCOUNTER — Other Ambulatory Visit: Payer: Self-pay | Admitting: Obstetrics and Gynecology

## 2020-11-06 NOTE — Telephone Encounter (Signed)
Last AEX 09/16/19. Mammo scheduled 12/23/19 and was negative. AEX scheduled for 01/22/21.

## 2020-11-12 ENCOUNTER — Other Ambulatory Visit: Payer: Self-pay | Admitting: Obstetrics and Gynecology

## 2020-11-12 DIAGNOSIS — Z1231 Encounter for screening mammogram for malignant neoplasm of breast: Secondary | ICD-10-CM

## 2021-01-02 ENCOUNTER — Other Ambulatory Visit: Payer: Self-pay

## 2021-01-02 ENCOUNTER — Ambulatory Visit
Admission: RE | Admit: 2021-01-02 | Discharge: 2021-01-02 | Disposition: A | Discharge status: Home / Self Care | Payer: Managed Care, Other (non HMO) | Source: Ambulatory Visit | Attending: Obstetrics and Gynecology | Admitting: Obstetrics and Gynecology

## 2021-01-02 DIAGNOSIS — Z1231 Encounter for screening mammogram for malignant neoplasm of breast: Secondary | ICD-10-CM

## 2021-01-22 ENCOUNTER — Other Ambulatory Visit (HOSPITAL_COMMUNITY)
Admission: RE | Admit: 2021-01-22 | Discharge: 2021-01-22 | Disposition: A | Discharge status: Home / Self Care | Payer: Managed Care, Other (non HMO) | Source: Ambulatory Visit | Attending: Obstetrics and Gynecology | Admitting: Obstetrics and Gynecology

## 2021-01-22 ENCOUNTER — Other Ambulatory Visit: Payer: Self-pay

## 2021-01-22 ENCOUNTER — Ambulatory Visit (INDEPENDENT_AMBULATORY_CARE_PROVIDER_SITE_OTHER): Payer: Managed Care, Other (non HMO) | Admitting: Obstetrics and Gynecology

## 2021-01-22 ENCOUNTER — Encounter: Payer: Self-pay | Admitting: Obstetrics and Gynecology

## 2021-01-22 VITALS — BP 122/72 | HR 91 | Ht 63.5 in | Wt 150.0 lb

## 2021-01-22 DIAGNOSIS — Z01419 Encounter for gynecological examination (general) (routine) without abnormal findings: Secondary | ICD-10-CM | POA: Insufficient documentation

## 2021-01-22 MED ORDER — PREMARIN 0.625 MG/GM VA CREA: TOPICAL_CREAM | VAGINAL | 1 refills | Status: DC

## 2021-01-22 NOTE — Patient Instructions (Signed)

## 2021-01-22 NOTE — Progress Notes (Signed)
60 y.o. [id number] Married Caucasian female here for annual exam.    Vaccinated against Covid and did not boost.   Using Premarin vaginal cream.  Wants refill.  PCP:  Merri Brunette, MD  Patient's last menstrual period was 09/29/2005.           Sexually active: Yes.    The current method of family planning is Tubal/post menopausal status.    Exercising: Yes.    runs 4days/week--3 miles Smoker:  no  Health Maintenance: Pap: 09-16-19 Neg:Neg HR HPV, 09-10-18 AGUS:Neg HR HPV,09-04-17 AGUS:Neg HR HPV History of abnormal Pap:  Yes, pap12-13-19 AGUS:Neg HR HPV with conization of cervix 11-02-18 showing CIN II/III with margins negative. 09-04-17 AGUS:Neg HR HPV with colpo showing LGSIL and neg.ECC.Pap 07/30/16 - ASCUS-H, neg HR HPV. Colposcopy 09/03/16 -Satisfactory.ECC showed LGSIL squamous dysplasia and benign endocervical cells, bx at 2:00 showed LGSIL. P16 staining negative in both specimens.EMB - atrophic endometrium. Pap 03/13/17- WNL, negative HR HPV--Hx cryotherapy years ago MMG:  01-02-21 3D/Neg/BiRads1 Colonoscopy: 2014 normal;next 2024 BMD:  12-21-19  Result :Osteopenia--Reclast TDaP: 11-30-13 Gardasil:   no HIV:2012 Neg per patient Hep C: 07/2017 Neg with PCP Screening Labs:  PCP   reports that she has never smoked. She has never used smokeless tobacco. She reports that she does not drink alcohol and does not use drugs.  Past Medical History:  Diagnosis Date  . Acute mastitis of right breast 07/13/2019  . Cervical dysplasia, mild 2017   Cotesting due in June 2018.   Marland Kitchen Elevated blood sugar   . 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 Osteoporosis     Past Surgical History:  Procedure Laterality Date  . CERVICAL CONIZATION W/BX N/A 11/02/2018   Procedure: CONIZATION CERVIX WITH BIOPSY;  CIN II/III - Surgeon: Patton Salles, MD;  Location: [facility];  Service: Gynecology;  Laterality: N/A;  . DILATION AND CURETTAGE OF UTERUS N/A  11/02/2018   Procedure: DILATATION AND CURETTAGE Fractional;  Surgeon: Patton Salles, MD;  Location: [facility];  Service: Gynecology;  Laterality: N/A;  Fractional D&C  . GANGLION CYST EXCISION     LEFT  . GYNECOLOGIC CRYOSURGERY  1987   CIN I  . KNEE ARTHROSCOPY     1980  . MANDIBLE SURGERY    . ORIF CALCANEOUS FRACTURE Left 05/22/2015   Procedure: OPEN REDUCTION INTERNAL FIXATION (ORIF) CALCANEOUS FRACTURE;  Surgeon: Teryl Lucy, MD;  Location: [facility] OR;  Service: Orthopedics;  Laterality: Left;  . ROTATOR CUFF REPAIR Left   . SHOULDER ARTHROSCOPY Right 08/2009  . TUBAL LIGATION      Current Outpatient Medications  Medication Sig Dispense Refill  . acetaminophen (TYLENOL) 500 MG tablet Take 1,000 mg by mouth every 8 (eight) hours as needed for mild pain or moderate pain.    . meloxicam (MOBIC) 15 MG tablet Take 15 mg by mouth as needed.     . Multiple Vitamin (MULTIVITAMIN) capsule Take 1 capsule by mouth daily.    . Omega-3 Fatty Acids (FISH OIL) 1000 MG CAPS 2,000 mg 1 (one) hour before bedtime.    Marland Kitchen PREMARIN vaginal cream PLACE 1/2 GRAM VAGINALLY AT BEDTIME 2-3 NIGHTS PER WEEK. 30 g 0  . zoledronic acid (RECLAST) 5 MG/100ML SOLN injection Inject 5 mg into the vein once. Once yearly     No current facility-administered medications for this visit.    Family History  Adopted: Yes    Review of  Systems  Exam:   BP 122/72   Pulse 91   Ht 5' 3.5" (1.613 m)   Wt 150 lb (68 kg)   LMP 09/29/2005   SpO2 99%   BMI 26.15 kg/m     General appearance: alert, cooperative and appears stated age Head: normocephalic, without obvious abnormality, atraumatic Neck: no adenopathy, supple, symmetrical, trachea midline and thyroid normal to inspection and palpation Lungs: clear to auscultation bilaterally Breasts: normal appearance, no masses or tenderness, No nipple retraction or dimpling, No nipple discharge or bleeding, No axillary adenopathy Heart: regular  rate and rhythm Abdomen: soft, non-tender; no masses, no organomegaly Extremities: extremities normal, atraumatic, no cyanosis or edema Skin: skin color, texture, turgor normal. No rashes or lesions Lymph nodes: cervical, supraclavicular, and axillary nodes normal. Neurologic: grossly normal  Pelvic: External genitalia:  no lesions              No abnormal inguinal nodes palpated.              Urethra:  normal appearing urethra with no masses, tenderness or lesions              Bartholins and Skenes: normal                 Vagina: normal appearing vagina with normal color and discharge, no lesions              Cervix: visually flush with vaginal apex.                Pap taken: Yes.   Bimanual Exam:  Uterus:  normal size, contour, position, consistency, mobility, non-tender              Adnexa: no mass, fullness, tenderness              Rectal exam: Yes.  .  Confirms.              Anus:  normal sphincter tone, no lesions  Chaperone was present for exam.  Assessment:   Well woman visit with normal exam. 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  Negative margins.  Hx mastitis right breast.  Osteoporosis.  On Reclast.  Follow up study showing osteopenia.   Plan: Mammogram screening discussed. Self breast awareness reviewed. Pap and HR HPV as above. Guidelines for Calcium, Vitamin D, regular exercise program including cardiovascular and weight bearing exercise. Refill of Premarin vaginal cream.  I discussed potential effect on breast cancer.  Labs with PCP. Follow up annually and prn.

## 2021-01-24 LAB — CYTOLOGY - PAP
Adequacy: ABSENT
Comment: NEGATIVE
Diagnosis: UNDETERMINED — AB
High risk HPV: NEGATIVE

## 2021-01-28 ENCOUNTER — Encounter: Payer: Self-pay | Admitting: Obstetrics and Gynecology

## 2021-01-28 ENCOUNTER — Telehealth: Payer: Self-pay | Admitting: *Deleted

## 2021-01-28 ENCOUNTER — Other Ambulatory Visit: Payer: Self-pay | Admitting: *Deleted

## 2021-01-28 DIAGNOSIS — R87610 Atypical squamous cells of undetermined significance on cytologic smear of cervix (ASC-US): Secondary | ICD-10-CM

## 2021-01-28 NOTE — Telephone Encounter (Signed)
-----   Message from Patton Salles, MD sent at 01/28/2021 10:23 AM EDT ----- Please contact patient with results of her pap showing ASCUS and negative HR HPV.  She has a history of CIN III.  By ASCCP guidelines, she needs a colposcopy.  Please schedule with me.

## 2021-01-28 NOTE — Telephone Encounter (Signed)
Message left to return call to Hillery Bhalla at [phone].   

## 2021-02-12 NOTE — Telephone Encounter (Signed)
Patient aware of results, see result note. Patient is scheduled for colposcopy on 02-28-21 with Dr. Edward Jolly.   Encounter closed.

## 2021-02-27 NOTE — Progress Notes (Signed)
  Subjective:     Patient ID: Traci Hoffman, female   DOB: 11-16-1960, 60 y.o.   MRN: [id number]  HPI  Patient here today for colposcopy with pap 01-22-21 ASCUS:Neg HR HPV.  PAP HISTORY: 01-22-21 ASCUS:Neg HR HPV 09-16-19 Neg:Neg HR HPV 11-02-18 conization of cervix showing CIN II/III with neg.margins. 09-10-18 AGUS:Neg HR HPV 09-04-17 AGUS:Neg HR HPV    Review of Systems  All other systems reviewed and are negative.   LMP: PMP Contraception:Tubal/PMP     Objective:   Physical Exam Genitourinary:      Colposcopy - cervix, vagina, and vulva.  Consent for procedure.  3% acetic acid used in vagina. White light and green light filter used.  Colposcopy satisfactory:  Yes   _____          No    _x____ Findings:    Cervix:  Os not seen.  Whitish coloration to the cervix but no specific lesions.  Vagina:  No lesions. Biopsies:   Cervix. Minimal EBL. No complications.      Assessment:     ASCUS pap, negative HR HPV.  Status post conization of cervix - CIN II/III preceded by AGUS pap and negative HR HPV. Menopausal female.     Plan:      Current pap and prior history reviewed.  FU biopsy result.  Consultation with GYN ONC through Epic communication or personal office visit after biopsy has returned.

## 2021-02-28 ENCOUNTER — Other Ambulatory Visit: Payer: Self-pay | Admitting: Obstetrics and Gynecology

## 2021-02-28 ENCOUNTER — Encounter: Payer: Self-pay | Admitting: Obstetrics and Gynecology

## 2021-02-28 ENCOUNTER — Ambulatory Visit (INDEPENDENT_AMBULATORY_CARE_PROVIDER_SITE_OTHER): Payer: Managed Care, Other (non HMO) | Admitting: Obstetrics and Gynecology

## 2021-02-28 ENCOUNTER — Other Ambulatory Visit (HOSPITAL_COMMUNITY)
Admission: RE | Admit: 2021-02-28 | Discharge: 2021-02-28 | Disposition: A | Discharge status: Home / Self Care | Payer: Managed Care, Other (non HMO) | Source: Ambulatory Visit | Attending: Obstetrics and Gynecology | Admitting: Obstetrics and Gynecology

## 2021-02-28 ENCOUNTER — Other Ambulatory Visit: Payer: Self-pay

## 2021-02-28 DIAGNOSIS — R87610 Atypical squamous cells of undetermined significance on cytologic smear of cervix (ASC-US): Secondary | ICD-10-CM | POA: Diagnosis not present

## 2021-02-28 DIAGNOSIS — N870 Mild cervical dysplasia: Secondary | ICD-10-CM | POA: Diagnosis not present

## 2021-02-28 NOTE — Patient Instructions (Signed)

## 2021-03-01 LAB — SURGICAL PATHOLOGY

## 2021-03-11 ENCOUNTER — Telehealth: Payer: Self-pay | Admitting: Obstetrics and Gynecology

## 2021-03-11 DIAGNOSIS — R87610 Atypical squamous cells of undetermined significance on cytologic smear of cervix (ASC-US): Secondary | ICD-10-CM

## 2021-03-11 NOTE — Telephone Encounter (Signed)
-----   Message from Adolphus Birchwood, MD sent at 03/07/2021  3:54 PM EDT ----- Regarding: RE: recommendation for care plan Hi Dr Edward Jolly, It might be good to get a transvaginal US to ensure that the atypical cells are not coming from an issue higher in the genital tract (eg ovarian/fallopian tube issue) given that she is HPV negative. Traci Hoffman  ----- Message ----- From: Patton Salles, MD Sent: 03/07/2021   8:20 AM EDT To: Patton Salles, MD, Adolphus Birchwood, MD Subject: recommendation for care plan                   Hello Dr. Andrey Farmer,   I would appreciate some guidance for Ms. Delaguila who you have seen on 11/18/19 regarding her cervical dysplasia an inability to evaluate the endocervix due to stenosis post op.  She is status post cold knife conization with hysteroscopy and dilation and curettage 11/02/18 for an AGUS pap with negative HR HPV.  Her final pathology showed CIN II/III with negative margins.  Her endocervical curettage and endometrial sampling were benign.   Her recent pap 01/02/21 showed atypia and negative HR HPV. Colposcopy was unsatisfactory and her biopsy of the inferior cervix showed LGSIL. Her cervix is flush with the vaginal cuff and she has no identifiable cervical os.   Could you please make a recommendation for care?  Thank you for your expertise.    Conley Simmonds, MD

## 2021-03-11 NOTE — Telephone Encounter (Signed)
Patient informed with below note, patient said she will call back to schedule.

## 2021-03-11 NOTE — Telephone Encounter (Signed)
Left message for patient to call.

## 2021-03-11 NOTE — Telephone Encounter (Signed)
Please contact patient and schedule a pelvic ultrasound and follow up with me.   Dr. Adolphus Birchwood from GYN Oncology has recommended a pelvic ultrasound to understand if the patient's atypical cells are coming from a source other than her cervix.

## 2021-03-12 NOTE — Telephone Encounter (Signed)
Ultrasound scheduled on 04/09/21

## 2021-04-09 ENCOUNTER — Other Ambulatory Visit: Payer: Self-pay

## 2021-04-09 ENCOUNTER — Ambulatory Visit (INDEPENDENT_AMBULATORY_CARE_PROVIDER_SITE_OTHER): Payer: Managed Care, Other (non HMO) | Admitting: Obstetrics and Gynecology

## 2021-04-09 ENCOUNTER — Ambulatory Visit (INDEPENDENT_AMBULATORY_CARE_PROVIDER_SITE_OTHER): Payer: Managed Care, Other (non HMO)

## 2021-04-09 ENCOUNTER — Encounter: Payer: Self-pay | Admitting: Obstetrics and Gynecology

## 2021-04-09 VITALS — BP 120/74

## 2021-04-09 DIAGNOSIS — R87610 Atypical squamous cells of undetermined significance on cytologic smear of cervix (ASC-US): Secondary | ICD-10-CM

## 2021-04-09 DIAGNOSIS — N882 Stricture and stenosis of cervix uteri: Secondary | ICD-10-CM | POA: Diagnosis not present

## 2021-04-09 NOTE — Progress Notes (Signed)
GYNECOLOGY  VISIT   HPI: 60 y.o.   Married  Caucasian  female   [phone] with Patient's last menstrual period was 09/29/2005.   here for   U/S check. Pelvic US recommended by GYN ONC to evaluate anatomy higher than cervix as way to rule out other sources of the atypia noted on her pap.  Pap 01/02/21 Atypia and negative HR HPV.  Colpo unsatisfactory and biopsy showed LGSIL.  Her cervix is stenotic and flush with the cervix and not amenable to excisional procedures.   Using Premarin vaginal cream three times weekly, but can forget.   Denies vaginal bleeding.   Hx prior AGUS pap with negative HR HPV and ultimately conization showing CIN II/III in 2020.   GYNECOLOGIC HISTORY: Patient's last menstrual period was 09/29/2005. Contraception:  BTL Menopausal hormone therapy:  Premarin vag cream Last mammogram:  01-02-21 Last pap smear:   01-22-21        OB History     Gravida  4   Para  2   Term  2   Preterm      AB  2   Living  2      SAB  2   IAB      Ectopic      Multiple      Live Births                 Patient Active Problem List   Diagnosis Date Noted   Osteoporosis 07/01/2019   Calcaneus fracture, left 05/22/2015    Past Medical History:  Diagnosis Date   Acute mastitis of right breast 07/13/2019   Cervical dysplasia, mild 2017   Cotesting due in June 2018.    Elevated blood sugar    Narcotic abuse (HCC)    --Percocet and Vicodin abuse   Osteoporosis     Past Surgical History:  Procedure Laterality Date   CERVICAL CONIZATION W/BX N/A 11/02/2018   Procedure: CONIZATION CERVIX WITH BIOPSY;  CIN II/III - Surgeon: Patton Salles, MD;  Location: [facility];  Service: Gynecology;  Laterality: N/A;   DILATION AND CURETTAGE OF UTERUS N/A 11/02/2018   Procedure: DILATATION AND CURETTAGE Fractional;  Surgeon: Patton Salles, MD;  Location: [facility];  Service: Gynecology;  Laterality: N/A;  Fractional D&C    GANGLION CYST EXCISION     LEFT   GYNECOLOGIC CRYOSURGERY  1987   CIN I   KNEE ARTHROSCOPY     1980   MANDIBLE SURGERY     ORIF CALCANEOUS FRACTURE Left 05/22/2015   Procedure: OPEN REDUCTION INTERNAL FIXATION (ORIF) CALCANEOUS FRACTURE;  Surgeon: Teryl Lucy, MD;  Location: [facility];  Service: Orthopedics;  Laterality: Left;   ROTATOR CUFF REPAIR Left    SHOULDER ARTHROSCOPY Right 08/2009   TUBAL LIGATION      Current Outpatient Medications  Medication Sig Dispense Refill   acetaminophen (TYLENOL) 500 MG tablet Take 1,000 mg by mouth every 8 (eight) hours as needed for mild pain or moderate pain.     conjugated estrogens (PREMARIN) vaginal cream Place 1/2 gram per vagina at bedtime 2 - 3 times a week as needed. 30 g 1   meloxicam (MOBIC) 15 MG tablet Take 15 mg by mouth as needed.      Multiple Vitamin (MULTIVITAMIN) capsule Take 1 capsule by mouth daily.     Omega-3 Fatty Acids (FISH OIL) 1000 MG CAPS 2,000 mg 1 (one) hour before bedtime.  No current facility-administered medications for this visit.     ALLERGIES: Patient has no known allergies.  Family History  Adopted: Yes    Social History   Socioeconomic History   Marital status: Married    Spouse name: Not on file   Number of children: Not on file   Years of education: Not on file   Highest education level: Not on file  Occupational History   Not on file  Tobacco Use   Smoking status: Never   Smokeless tobacco: Never  Vaping Use   Vaping Use: Never used  Substance and Sexual Activity   Alcohol use: No    Alcohol/week: 0.0 standard drinks   Drug use: No   Sexual activity: Yes    Partners: Male    Birth control/protection: Post-menopausal, Surgical    Comment: Tubal  Other Topics Concern   Not on file  Social History Narrative   Not on file   Social Determinants of Health   Financial Resource Strain: Not on file  Food Insecurity: Not on file  Transportation Needs: Not on file  Physical  Activity: Not on file  Stress: Not on file  Social Connections: Not on file  Intimate Partner Violence: Not on file    Review of Systems  See HPI.  PHYSICAL EXAMINATION:    BP 120/74 (BP Location: Right Arm, Patient Position: Sitting, Cuff Size: Normal)   LMP 09/29/2005     General appearance: alert, cooperative and appears stated age  Pelvic US uterus 5.54 x 5.15 x 3.34 cm.  EMS 1.83 mm.  Small fluid in the endometrial canal.  Smooth walls. No masses, thickening or abnormal blood flow. Ovaries normal, atrophic.  No free fluid. Chaperone was present for exam.  ASSESSMENT  ASCUS pap with negative HR HPV.   Colposcopy showing LGSIL. Hx prior AGUS pap and CIN II/III on cervical conization.  Normal pelvic anatomy on ultrasound today.  PLAN  Pelvic ultrasound findings and images reviewed with patient.  Prior pap, colpo and conization reviewed. Will plan for next pap and HR HPV in May, 2023.

## 2021-06-12 ENCOUNTER — Ambulatory Visit (HOSPITAL_COMMUNITY): Payer: Managed Care, Other (non HMO)

## 2021-06-18 ENCOUNTER — Other Ambulatory Visit (HOSPITAL_COMMUNITY): Payer: Self-pay | Admitting: *Deleted

## 2021-06-19 ENCOUNTER — Other Ambulatory Visit: Payer: Self-pay

## 2021-06-19 ENCOUNTER — Ambulatory Visit (HOSPITAL_COMMUNITY)
Admission: RE | Admit: 2021-06-19 | Discharge: 2021-06-19 | Disposition: A | Discharge status: Home / Self Care | Payer: BC Managed Care – PPO | Source: Ambulatory Visit | Attending: Sports Medicine | Admitting: Sports Medicine

## 2021-06-19 DIAGNOSIS — M810 Age-related osteoporosis without current pathological fracture: Secondary | ICD-10-CM | POA: Insufficient documentation

## 2021-06-19 MED ORDER — ZOLEDRONIC ACID 5 MG/100ML IV SOLN
INTRAVENOUS | Status: AC
  Administered 2021-06-19: 5 mg via INTRAVENOUS
  Filled 2021-06-19: qty 100

## 2021-06-19 MED ORDER — ZOLEDRONIC ACID 5 MG/100ML IV SOLN: 5.0000 mg | Freq: Once | INTRAVENOUS | Status: AC

## 2021-10-28 DIAGNOSIS — M79672 Pain in left foot: Secondary | ICD-10-CM | POA: Diagnosis not present

## 2021-10-28 DIAGNOSIS — Z0000 Encounter for general adult medical examination without abnormal findings: Secondary | ICD-10-CM | POA: Diagnosis not present

## 2021-10-28 DIAGNOSIS — R7303 Prediabetes: Secondary | ICD-10-CM | POA: Diagnosis not present

## 2021-10-28 DIAGNOSIS — E7800 Pure hypercholesterolemia, unspecified: Secondary | ICD-10-CM | POA: Diagnosis not present

## 2021-10-28 DIAGNOSIS — E559 Vitamin D deficiency, unspecified: Secondary | ICD-10-CM | POA: Diagnosis not present

## 2021-11-21 ENCOUNTER — Other Ambulatory Visit: Payer: Self-pay | Admitting: Obstetrics and Gynecology

## 2021-11-21 DIAGNOSIS — Z1231 Encounter for screening mammogram for malignant neoplasm of breast: Secondary | ICD-10-CM

## 2021-12-04 DIAGNOSIS — H2513 Age-related nuclear cataract, bilateral: Secondary | ICD-10-CM | POA: Diagnosis not present

## 2021-12-04 DIAGNOSIS — H04123 Dry eye syndrome of bilateral lacrimal glands: Secondary | ICD-10-CM | POA: Diagnosis not present

## 2021-12-04 DIAGNOSIS — H25041 Posterior subcapsular polar age-related cataract, right eye: Secondary | ICD-10-CM | POA: Diagnosis not present

## 2021-12-25 DIAGNOSIS — L821 Other seborrheic keratosis: Secondary | ICD-10-CM | POA: Diagnosis not present

## 2021-12-25 DIAGNOSIS — L814 Other melanin hyperpigmentation: Secondary | ICD-10-CM | POA: Diagnosis not present

## 2021-12-25 DIAGNOSIS — D225 Melanocytic nevi of trunk: Secondary | ICD-10-CM | POA: Diagnosis not present

## 2021-12-25 DIAGNOSIS — L578 Other skin changes due to chronic exposure to nonionizing radiation: Secondary | ICD-10-CM | POA: Diagnosis not present

## 2022-01-01 DIAGNOSIS — E7800 Pure hypercholesterolemia, unspecified: Secondary | ICD-10-CM | POA: Diagnosis not present

## 2022-01-15 ENCOUNTER — Ambulatory Visit
Admission: RE | Admit: 2022-01-15 | Discharge: 2022-01-15 | Disposition: A | Discharge status: Home / Self Care | Payer: BC Managed Care – PPO | Source: Ambulatory Visit | Attending: Obstetrics and Gynecology | Admitting: Obstetrics and Gynecology

## 2022-01-15 DIAGNOSIS — Z1231 Encounter for screening mammogram for malignant neoplasm of breast: Secondary | ICD-10-CM

## 2022-01-15 DIAGNOSIS — M8589 Other specified disorders of bone density and structure, multiple sites: Secondary | ICD-10-CM | POA: Diagnosis not present

## 2022-01-15 DIAGNOSIS — Z780 Asymptomatic menopausal state: Secondary | ICD-10-CM | POA: Diagnosis not present

## 2022-01-29 ENCOUNTER — Ambulatory Visit: Payer: Managed Care, Other (non HMO) | Admitting: Obstetrics and Gynecology

## 2022-02-05 ENCOUNTER — Ambulatory Visit (INDEPENDENT_AMBULATORY_CARE_PROVIDER_SITE_OTHER): Payer: Managed Care, Other (non HMO) | Admitting: Obstetrics and Gynecology

## 2022-02-05 ENCOUNTER — Other Ambulatory Visit (HOSPITAL_COMMUNITY)
Admission: RE | Admit: 2022-02-05 | Discharge: 2022-02-05 | Disposition: A | Discharge status: Home / Self Care | Payer: Managed Care, Other (non HMO) | Source: Ambulatory Visit | Attending: Obstetrics and Gynecology | Admitting: Obstetrics and Gynecology

## 2022-02-05 ENCOUNTER — Encounter: Payer: Self-pay | Admitting: Obstetrics and Gynecology

## 2022-02-05 VITALS — BP 120/74 | HR 81 | Ht 63.0 in | Wt 138.0 lb

## 2022-02-05 DIAGNOSIS — Z124 Encounter for screening for malignant neoplasm of cervix: Secondary | ICD-10-CM | POA: Diagnosis not present

## 2022-02-05 DIAGNOSIS — Z01419 Encounter for gynecological examination (general) (routine) without abnormal findings: Secondary | ICD-10-CM | POA: Diagnosis not present

## 2022-02-05 MED ORDER — ESTRADIOL 0.1 MG/GM VA CREA: TOPICAL_CREAM | VAGINAL | 3 refills | Status: DC

## 2022-02-05 NOTE — Patient Instructions (Signed)

## 2022-02-05 NOTE — Progress Notes (Signed)
61 y.o. [id number] Married Caucasian female here for annual exam.   ? ?Has not been using vaginal Premarin cream.  ? ?Some left sided breast tenderness for a while.  ?Just had a normal mammogram. ?She carries her granddaughter on her left side.  ? ?PCP:  Merri Brunette, MD  ? ?Patient's last menstrual period was 09/29/2005.     ?  ?    ?Sexually active: Yes.    ?The current method of family planning is tubal ligation/postmenopausal    ?Exercising: Yes.     runner ?Smoker:  no ? ?Health Maintenance: ?Pap: 01-22-21 ASCUS:Neg HR HPV, 09-16-19 Neg:Neg HR HPV, 09-10-18 AGUS:Neg HR HPV ?History of abnormal Pap:  yes,  02-28-21 colpo revealed LSIL;01-22-21 pap ASCUS:Neg HR HPV. 09-10-18  AGUS:Neg HR HPV with conization of cervix 11-02-18 showing CIN II/III with margins negative. 09-04-17 AGUS:Neg HR HPV with colpo showing LGSIL and neg.ECC.Pap 07/30/16 - ASCUS-H, neg HR HPV. Colposcopy 09/03/16 - Satisfactory. ECC showed LGSIL squamous dysplasia and benign endocervical cells, bx at 2:00 showed LGSIL. P16 staining negative in both specimens.  EMB - atrophic endometrium.  ?Pap 03/13/17 - WNL, negative HR HPV--Hx cryotherapy years ago ?MMG:  01-15-22 Neg/BiRads1 ?Colonoscopy:   2014 normal;next 2024 ?BMD:  12/2021  Result :Osteoporosis--Reclast thru orthopedic ?TDaP:  11-30-13 ?Gardasil:   no ?HIV: Neg per patient in the past ?Hep C: 07/2017 Neg thru PCP ?Screening Labs:  PCP ? ? reports that she has never smoked. She has never used smokeless tobacco. She reports that she does not drink alcohol and does not use drugs. ? ?Past Medical History:  ?Diagnosis Date  ? Acute mastitis of right breast 07/13/2019  ? Cervical dysplasia, mild 2017  ? Cotesting due in June 2018.   ? Elevated blood sugar   ? Narcotic abuse (HCC)   ? --Percocet and Vicodin abuse  ? Osteoporosis   ? ? ?Past Surgical History:  ?Procedure Laterality Date  ? CERVICAL CONIZATION W/BX N/A 11/02/2018  ? Procedure: CONIZATION CERVIX WITH BIOPSY;  CIN II/III - Surgeon: Patton Salles, MD;  Location: [facility];  Service: Gynecology;  Laterality: N/A;  ? DILATION AND CURETTAGE OF UTERUS N/A 11/02/2018  ? Procedure: DILATATION AND CURETTAGE Fractional;  Surgeon: Patton Salles, MD;  Location: [facility];  Service: Gynecology;  Laterality: N/A;  Fractional D&C  ? GANGLION CYST EXCISION    ? LEFT  ? GYNECOLOGIC CRYOSURGERY  1987  ? CIN I  ? KNEE ARTHROSCOPY    ? 1980  ? MANDIBLE SURGERY    ? ORIF CALCANEOUS FRACTURE Left 05/22/2015  ? Procedure: OPEN REDUCTION INTERNAL FIXATION (ORIF) CALCANEOUS FRACTURE;  Surgeon: Teryl Lucy, MD;  Location: [facility];  Service: Orthopedics;  Laterality: Left;  ? ROTATOR CUFF REPAIR Left   ? SHOULDER ARTHROSCOPY Right 08/2009  ? TUBAL LIGATION    ? ? ?Current Outpatient Medications  ?Medication Sig Dispense Refill  ? acetaminophen (TYLENOL) 500 MG tablet Take 1,000 mg by mouth every 8 (eight) hours as needed for mild pain or moderate pain.    ? Cholecalciferol (VITAMIN D3) 25 MCG (1000 UT) CAPS 1 capsule    ? conjugated estrogens (PREMARIN) vaginal cream Place 1/2 gram per vagina at bedtime 2 - 3 times a week as needed. 30 g 1  ? meloxicam (MOBIC) 15 MG tablet Take 15 mg by mouth as needed.     ? Multiple Vitamin (MULTIVITAMIN) capsule Take 1 capsule by mouth daily.    ?  Omega-3 Fatty Acids (FISH OIL) 1000 MG CAPS 2,000 mg 1 (one) hour before bedtime.    ? rosuvastatin (CRESTOR) 5 MG tablet TAKE 1 TABLET BY MOUTH EVERY DAY FOR 30 DAYS    ? ?No current facility-administered medications for this visit.  ? ? ?Family History  ?Adopted: Yes  ? ? ?Review of Systems  ?All other systems reviewed and are negative. ? ?Exam:   ?BP 120/74   Pulse 81   Ht 5\' 3"  (1.6 m)   Wt 138 lb (62.6 kg)   LMP 09/29/2005   SpO2 99%   BMI 24.45 kg/m?     ?General appearance: alert, cooperative and appears stated age ?Head: normocephalic, without obvious abnormality, atraumatic ?Neck: no adenopathy, supple, symmetrical, trachea midline  and thyroid normal to inspection and palpation ?Lungs: clear to auscultation bilaterally ?Breasts: normal appearance, no masses or tenderness, No nipple retraction or dimpling, No nipple discharge or bleeding, No axillary adenopathy ?Heart: regular rate and rhythm ?Abdomen: soft, non-tender; no masses, no organomegaly ?Extremities: extremities normal, atraumatic, no cyanosis or edema ?Skin: skin color, texture, turgor normal. No rashes or lesions ?Lymph nodes: cervical, supraclavicular, and axillary nodes normal. ?Neurologic: grossly normal ? ?Pelvic: External genitalia:  no lesions ?             No abnormal inguinal nodes palpated. ?             Urethra:  normal appearing urethra with no masses, tenderness or lesions ?             Bartholins and Skenes: normal    ?             Vagina: normal appearing vagina with normal color and discharge, no lesions ?             Cervix: no lesions.  Cervix fairly flush with vaginal apex.  Os stenotic. ?             Pap taken: yes ?Bimanual Exam:  Uterus:  normal size, contour, position, consistency, mobility, non-tender ?             Adnexa: no mass, fullness, tenderness ?             Rectal exam: yes.  Confirms. ?             Anus:  normal sphincter tone, no lesions ? ?Chaperone was present for exam:  11/27/2005, CMA ? ?Assessment:   ?Well woman visit with gynecologic exam. 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  Negative margins.  ?Colposcopy in 2022 showing LGSIL. ?Hx mastitis right breast.  ?Osteoporosis.  On Reclast.  Followed by ortho.  ? ?Plan: ?Mammogram screening discussed. ?Self breast awareness reviewed. ?Pap and HR HPV as above. ?Guidelines for Calcium, Vitamin D, regular exercise program including cardiovascular and weight bearing exercise. ?  ?Follow up annually and prn.  ? ?After visit summary provided.  ? ? ? ?

## 2022-02-13 LAB — CYTOLOGY - PAP
Adequacy: ABSENT
Comment: NEGATIVE
Diagnosis: NEGATIVE
High risk HPV: NEGATIVE

## 2022-12-04 ENCOUNTER — Other Ambulatory Visit: Payer: Self-pay | Admitting: Family Medicine

## 2022-12-04 DIAGNOSIS — Z1231 Encounter for screening mammogram for malignant neoplasm of breast: Secondary | ICD-10-CM

## 2023-01-21 ENCOUNTER — Ambulatory Visit
Admission: RE | Admit: 2023-01-21 | Discharge: 2023-01-21 | Disposition: A | Discharge status: Home / Self Care | Payer: Managed Care, Other (non HMO) | Source: Ambulatory Visit

## 2023-01-21 DIAGNOSIS — Z1231 Encounter for screening mammogram for malignant neoplasm of breast: Secondary | ICD-10-CM

## 2023-01-26 ENCOUNTER — Other Ambulatory Visit: Payer: Self-pay | Admitting: Family Medicine

## 2023-01-26 DIAGNOSIS — Z1231 Encounter for screening mammogram for malignant neoplasm of breast: Secondary | ICD-10-CM

## 2023-01-28 NOTE — Progress Notes (Signed)
62 y.o. [id number] Married Caucasian female here for annual exam.    Taking Evista now for osteopenia.  Some hot flashes at night.   Using vaginal estrogen cream.   Going to [place] on vacation.   PCP:   Dr. Katrinka Blazing  Patient's last menstrual period was 09/29/2005.           Sexually active:  Yes.   The current method of family planning is post menopausal status/BTL.    Exercising: Yes.     Running 3x a week    Smoker:  no  Health Maintenance: Pap:  02/05/22 neg: HR HPV neg  History of abnormal Pap:  yes, 02-28-21 colpo revealed LSIL;01-22-21 pap ASCUS:Neg HR HPV. 09-10-18  AGUS:Neg HR HPV with conization of cervix 11-02-18 showing CIN II/III with margins negative. 09-04-17 AGUS:Neg HR HPV with colpo showing LGSIL and neg.ECC.Pap 07/30/16 - ASCUS-H, neg HR HPV. Colposcopy 09/03/16 - Satisfactory. ECC showed LGSIL squamous dysplasia and benign endocervical cells, bx at 2:00 showed LGSIL. P16 staining negative in both specimens.  EMB - atrophic endometrium.   MMG:  rescheduled until June Colonoscopy:  12/29/12 - will do in July.  BMD:   12/21/19  Result  osteopenia TDaP:  11/30/13 Gardasil:   no HIV: neg Hep C: 07/2017 neg Screening Labs:  PCP   reports that she has never smoked. She has never used smokeless tobacco. She reports that she does not drink alcohol and does not use drugs.  Past Medical History:  Diagnosis Date   Acute mastitis of right breast 07/13/2019   Cervical dysplasia, mild 2017   Cotesting due in June 2018.    Elevated blood sugar    Narcotic abuse (HCC)    --Percocet and Vicodin abuse   Osteoporosis     Past Surgical History:  Procedure Laterality Date   CERVICAL CONIZATION W/BX N/A 11/02/2018   Procedure: CONIZATION CERVIX WITH BIOPSY;  CIN II/III - Surgeon: Patton Salles, MD;  Location: [facility];  Service: Gynecology;  Laterality: N/A;   DILATION AND CURETTAGE OF UTERUS N/A 11/02/2018   Procedure: DILATATION AND CURETTAGE Fractional;  Surgeon:  Patton Salles, MD;  Location: [facility];  Service: Gynecology;  Laterality: N/A;  Fractional D&C   GANGLION CYST EXCISION     LEFT   GYNECOLOGIC CRYOSURGERY  1987   CIN I   KNEE ARTHROSCOPY     1980   MANDIBLE SURGERY     ORIF CALCANEOUS FRACTURE Left 05/22/2015   Procedure: OPEN REDUCTION INTERNAL FIXATION (ORIF) CALCANEOUS FRACTURE;  Surgeon: Teryl Lucy, MD;  Location: [facility];  Service: Orthopedics;  Laterality: Left;   ROTATOR CUFF REPAIR Left    SHOULDER ARTHROSCOPY Right 08/2009   TUBAL LIGATION      Current Outpatient Medications  Medication Sig Dispense Refill   acetaminophen (TYLENOL) 500 MG tablet Take 1,000 mg by mouth every 8 (eight) hours as needed for mild pain or moderate pain.     Cholecalciferol (VITAMIN D3) 25 MCG (1000 UT) CAPS 1 capsule     estradiol (ESTRACE) 0.1 MG/GM vaginal cream Use 1/2 g vaginally every night for the first 2 weeks, then use 1/2 g vaginally two or three times per week as needed to maintain symptom relief. 42.5 g 3   meloxicam (MOBIC) 15 MG tablet Take 15 mg by mouth as needed.      Multiple Vitamin (MULTIVITAMIN) capsule Take 1 capsule by mouth daily.     Omega-3 Fatty Acids (  FISH OIL) 1000 MG CAPS 2,000 mg 1 (one) hour before bedtime.     raloxifene (EVISTA) 60 MG tablet Take 60 mg by mouth daily.     rosuvastatin (CRESTOR) 5 MG tablet TAKE 1 TABLET BY MOUTH EVERY DAY FOR 30 DAYS     No current facility-administered medications for this visit.    Family History  Adopted: Yes    Review of Systems  All other systems reviewed and are negative.   Exam:   BP 116/80 (BP Location: Left Arm, Patient Position: Sitting, Cuff Size: Normal)   Pulse 74   Ht 5' 4.5" (1.638 m)   Wt 141 lb (64 kg)   LMP 09/29/2005   SpO2 99%   BMI 23.83 kg/m     General appearance: alert, cooperative and appears stated age Head: normocephalic, without obvious abnormality, atraumatic Neck: no adenopathy, supple, symmetrical,  trachea midline and thyroid normal to inspection and palpation Lungs: clear to auscultation bilaterally Breasts: normal appearance, no masses or tenderness, No nipple retraction or dimpling, No nipple discharge or bleeding, No axillary adenopathy Heart: regular rate and rhythm Abdomen: soft, non-tender; no masses, no organomegaly Extremities: extremities normal, atraumatic, no cyanosis or edema Skin: skin color, texture, turgor normal. No rashes or lesions Lymph nodes: cervical, supraclavicular, and axillary nodes normal. Neurologic: grossly normal  Pelvic: External genitalia:  no lesions              No abnormal inguinal nodes palpated.              Urethra:  normal appearing urethra with no masses, tenderness or lesions              Bartholins and Skenes: normal                 Vagina: normal appearing vagina with normal color and discharge, no lesions              Cervix: no lesions.  Stenotic and appears fairly flush with vaginal apex.               Pap taken: yes Bimanual Exam:  Uterus:  normal size, contour, position, consistency, mobility, non-tender              Adnexa: no mass, fullness, tenderness              Rectal exam: yes.  Confirms.              Anus:  normal sphincter tone, no lesions  Chaperone was present for exam:  Warren Lacy, CMA  Assessment:   Well woman visit with gynecologic exam. 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  Negative margins.  Colposcopy in 2022 showing LGSIL. Vaginal atrophy.  Hx mastitis right breast.  Osteoporosis.  On Evista.  Followed by ortho.   Plan: Mammogram screening discussed. Self breast awareness reviewed. Pap and HR HPV collected. Guidelines for Calcium, Vitamin D, regular exercise program including cardiovascular and weight bearing exercise. Refill of vaginal estrogen cream.  I discussed potential effect on breast cancer.  Will get copy of BMD from [facility] for 2023.   Follow up annually and prn.   After visit  summary provided.

## 2023-02-11 ENCOUNTER — Ambulatory Visit (INDEPENDENT_AMBULATORY_CARE_PROVIDER_SITE_OTHER): Payer: Managed Care, Other (non HMO) | Admitting: Obstetrics and Gynecology

## 2023-02-11 ENCOUNTER — Encounter: Payer: Self-pay | Admitting: Obstetrics and Gynecology

## 2023-02-11 ENCOUNTER — Other Ambulatory Visit (HOSPITAL_COMMUNITY)
Admission: RE | Admit: 2023-02-11 | Discharge: 2023-02-11 | Disposition: A | Discharge status: Home / Self Care | Payer: Managed Care, Other (non HMO) | Source: Ambulatory Visit | Attending: Obstetrics and Gynecology | Admitting: Obstetrics and Gynecology

## 2023-02-11 VITALS — BP 116/80 | HR 74 | Ht 64.5 in | Wt 141.0 lb

## 2023-02-11 DIAGNOSIS — Z124 Encounter for screening for malignant neoplasm of cervix: Secondary | ICD-10-CM | POA: Insufficient documentation

## 2023-02-11 DIAGNOSIS — Z87410 Personal history of cervical dysplasia: Secondary | ICD-10-CM | POA: Insufficient documentation

## 2023-02-11 DIAGNOSIS — Z01419 Encounter for gynecological examination (general) (routine) without abnormal findings: Secondary | ICD-10-CM

## 2023-02-11 MED ORDER — ESTRADIOL 0.1 MG/GM VA CREA: TOPICAL_CREAM | VAGINAL | 2 refills | Status: DC

## 2023-02-11 NOTE — Patient Instructions (Signed)

## 2023-02-16 LAB — CYTOLOGY - PAP
Adequacy: ABSENT
Comment: NEGATIVE
Diagnosis: NEGATIVE
High risk HPV: NEGATIVE

## 2023-03-04 ENCOUNTER — Ambulatory Visit
Admission: RE | Admit: 2023-03-04 | Discharge: 2023-03-04 | Disposition: A | Discharge status: Home / Self Care | Payer: Managed Care, Other (non HMO) | Source: Ambulatory Visit

## 2023-03-04 DIAGNOSIS — Z1231 Encounter for screening mammogram for malignant neoplasm of breast: Secondary | ICD-10-CM

## 2023-05-20 ENCOUNTER — Ambulatory Visit (INDEPENDENT_AMBULATORY_CARE_PROVIDER_SITE_OTHER): Payer: Managed Care, Other (non HMO) | Admitting: Podiatry

## 2023-05-20 ENCOUNTER — Encounter: Payer: Self-pay | Admitting: Podiatry

## 2023-05-20 DIAGNOSIS — M2042 Other hammer toe(s) (acquired), left foot: Secondary | ICD-10-CM

## 2023-05-20 NOTE — Progress Notes (Signed)
  Subjective:  Patient ID: Traci Hoffman, female    DOB: 20-Sep-1961,  MRN: [id number]  Chief Complaint  Patient presents with   Toe Pain    RM11: New patient- lesion of 5th left toe    62 y.o. female presents with the above complaint.  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  Patient states painful calluses but has gotten worse worse with ambulation as pressure started hurting over the last 2 weeks she is a runner and has been bothering her again.  She has been mostly when she is barefooted.  Pain scale is 5 out of 10 dull aching nature she wanted to discuss conservative treatment options   Review of Systems: Negative except as noted in the HPI. Denies N/V/F/Ch.  Past Medical History:  Diagnosis Date   Acute mastitis of right breast 07/13/2019   Cervical dysplasia, mild 2017   Cotesting due in June 2018.    Elevated blood sugar    Narcotic abuse (HCC)    --Percocet and Vicodin abuse   Osteoporosis     Current Outpatient Medications:    acetaminophen (TYLENOL) 500 MG tablet, Take 1,000 mg by mouth every 8 (eight) hours as needed for mild pain or moderate pain., Disp: , Rfl:    Cholecalciferol (VITAMIN D3) 25 MCG (1000 UT) CAPS, 1 capsule, Disp: , Rfl:    estradiol (ESTRACE) 0.1 MG/GM vaginal cream, Use 1/2 g vaginally two or three times per week as needed to maintain symptom relief., Disp: 42.5 g, Rfl: 2   meloxicam (MOBIC) 15 MG tablet, Take 15 mg by mouth as needed. , Disp: , Rfl:    Multiple Vitamin (MULTIVITAMIN) capsule, Take 1 capsule by mouth daily., Disp: , Rfl:    Omega-3 Fatty Acids (FISH OIL) 1000 MG CAPS, 2,000 mg 1 (one) hour before bedtime., Disp: , Rfl:    raloxifene (EVISTA) 60 MG tablet, Take 60 mg by mouth daily., Disp: , Rfl:    rosuvastatin (CRESTOR) 5 MG tablet, TAKE 1 TABLET BY MOUTH EVERY DAY FOR 30 DAYS, Disp: , Rfl:   Social History   Tobacco Use  Smoking Status Never  Smokeless Tobacco Never    No  Known Allergies Objective:  There were no vitals filed for this visit. There is no height or weight on file to calculate BMI. Constitutional Well developed. Well nourished.  Vascular Dorsalis pedis pulses palpable bilaterally. Posterior tibial pulses palpable bilaterally. Capillary refill normal to all digits.  No cyanosis or clubbing noted. Pedal hair growth normal.  Neurologic Normal speech. Oriented to person, place, and time. Epicritic sensation to light touch grossly present bilaterally.  Dermatologic Left fifth digit hammertoe contracture noted semiflexible with adductovarus rotation.  Pain on palpation to the deformity.  Orthopedic: Normal joint ROM without pain or crepitus bilaterally. No visible deformities. No bony tenderness.   Radiographs: None Assessment:   1. Hammertoe of left foot    Plan:  Patient was evaluated and treated and all questions answered.  Left fifth digit hammertoe contracture -All questions and concerns were discussed with the patient extensive detail I discussed shoe gear modification extensive detail.  Also discussed briefly surgical derotational arthroplasty of the fifth digit.  She states she would be provided if any foot and ankle issues in the future she will come back and see me.  No follow-ups on file.

## 2023-10-28 ENCOUNTER — Other Ambulatory Visit: Payer: Self-pay | Admitting: Medical Genetics

## 2023-11-16 ENCOUNTER — Other Ambulatory Visit (HOSPITAL_COMMUNITY)
Admission: RE | Admit: 2023-11-16 | Discharge: 2023-11-16 | Disposition: A | Discharge status: Home / Self Care | Payer: Self-pay | Source: Ambulatory Visit | Attending: Medical Genetics | Admitting: Medical Genetics

## 2023-11-25 LAB — GENECONNECT MOLECULAR SCREEN: Genetic Analysis Overall Interpretation: NEGATIVE

## 2024-02-08 ENCOUNTER — Other Ambulatory Visit: Payer: Self-pay | Admitting: Obstetrics and Gynecology

## 2024-02-08 DIAGNOSIS — Z1231 Encounter for screening mammogram for malignant neoplasm of breast: Secondary | ICD-10-CM

## 2024-02-17 ENCOUNTER — Encounter: Payer: Self-pay | Admitting: Obstetrics and Gynecology

## 2024-02-17 ENCOUNTER — Other Ambulatory Visit (HOSPITAL_COMMUNITY)
Admission: RE | Admit: 2024-02-17 | Discharge: 2024-02-17 | Disposition: A | Discharge status: Home / Self Care | Source: Ambulatory Visit | Attending: Obstetrics and Gynecology | Admitting: Obstetrics and Gynecology

## 2024-02-17 ENCOUNTER — Ambulatory Visit (INDEPENDENT_AMBULATORY_CARE_PROVIDER_SITE_OTHER): Payer: Managed Care, Other (non HMO) | Admitting: Obstetrics and Gynecology

## 2024-02-17 VITALS — BP 124/82 | HR 87 | Ht 64.0 in | Wt 150.0 lb

## 2024-02-17 DIAGNOSIS — Z87410 Personal history of cervical dysplasia: Secondary | ICD-10-CM

## 2024-02-17 DIAGNOSIS — Z124 Encounter for screening for malignant neoplasm of cervix: Secondary | ICD-10-CM | POA: Diagnosis present

## 2024-02-17 DIAGNOSIS — Z1331 Encounter for screening for depression: Secondary | ICD-10-CM | POA: Diagnosis not present

## 2024-02-17 DIAGNOSIS — Z01419 Encounter for gynecological examination (general) (routine) without abnormal findings: Secondary | ICD-10-CM | POA: Diagnosis not present

## 2024-02-17 MED ORDER — ESTRADIOL 0.1 MG/GM VA CREA: TOPICAL_CREAM | VAGINAL | 2 refills | Status: AC

## 2024-02-17 NOTE — Progress Notes (Signed)
 63 y.o. [id number] Married Caucasian female here for annual exam.    Not using vaginal estrogen cream regularly.   Night sweats with Evista.  Ortho is prescribing.   Did a 5K this month. Going camping this weekend.   PCP: Faustina Hood, MD   Patient's last menstrual period was 09/29/2005.           Sexually active: Yes.    The current method of family planning is tubal ligation.    Menopausal hormone therapy:  Estrace  Exercising: Yes.    Running 3x week Smoker:  no  OB History  Gravida Para Term Preterm AB Living  4 2 2  2 2   SAB IAB Ectopic Multiple Live Births  2        # Outcome Date GA Lbr Len/2nd Weight Sex Type Anes PTL Lv  4 SAB           3 SAB           2 Term           1 Term              HEALTH MAINTENANCE: Last 2 paps:  02/11/23 neg HR HPV neg, 02/05/22 neg HR HPV neg History of abnormal Pap or positive HPV:  yes 01/22/21 ASCUS.  Colpo - LGSIL. Mammogram:   03/04/23 Breast Density Cat C, BIRADS Cat 1 neg  Colonoscopy:  2025 - Dr. Tova Fresh.   Bone Density:  01/15/22  Result  osteopenia.  She is due at [facility].    Immunization History  Administered Date(s) Administered   Influenza, Seasonal, Injecte, Preservative Fre 07/08/2016, 07/13/2017, 07/08/2018   Influenza-Unspecified 07/08/2016, 07/08/2017, 07/11/2018, 06/30/2019, 07/11/2021   Tdap 06/19/2013, 11/30/2013      reports that she has never smoked. She has never used smokeless tobacco. She reports that she does not drink alcohol and does not use drugs.  Past Medical History:  Diagnosis Date   Acute mastitis of right breast 07/13/2019   Cervical dysplasia, mild 2017   Cotesting due in June 2018.    Elevated blood sugar    Narcotic abuse (HCC)    --Percocet and Vicodin abuse   Osteoporosis     Past Surgical History:  Procedure Laterality Date   CERVICAL CONIZATION W/BX N/A 11/02/2018   Procedure: CONIZATION CERVIX WITH BIOPSY;  CIN II/III - Surgeon: Greta Leatherwood, MD;  Location: [facility];  Service: Gynecology;  Laterality: N/A;   DILATION AND CURETTAGE OF UTERUS N/A 11/02/2018   Procedure: DILATATION AND CURETTAGE Fractional;  Surgeon: Greta Leatherwood, MD;  Location: [facility];  Service: Gynecology;  Laterality: N/A;  Fractional D&C   GANGLION CYST EXCISION     LEFT   GYNECOLOGIC CRYOSURGERY  1987   CIN I   KNEE ARTHROSCOPY     1980   MANDIBLE SURGERY     ORIF CALCANEOUS FRACTURE Left 05/22/2015   Procedure: OPEN REDUCTION INTERNAL FIXATION (ORIF) CALCANEOUS FRACTURE;  Surgeon: Osa Blase, MD;  Location: [facility] OR;  Service: Orthopedics;  Laterality: Left;   ROTATOR CUFF REPAIR Left    SHOULDER ARTHROSCOPY Right 08/2009   TUBAL LIGATION      Current Outpatient Medications  Medication Sig Dispense Refill   acetaminophen  (TYLENOL ) 500 MG tablet Take 1,000 mg by mouth every 8 (eight) hours as needed for mild pain or moderate pain.     Cholecalciferol  (VITAMIN D3) 25 MCG (1000 UT) CAPS 1 capsule  estradiol  (ESTRACE ) 0.1 MG/GM vaginal cream Use 1/2 g vaginally two or three times per week as needed to maintain symptom relief. 42.5 g 2   meloxicam (MOBIC) 15 MG tablet Take 15 mg by mouth as needed.      Multiple Vitamin (MULTIVITAMIN) capsule Take 1 capsule by mouth daily.     raloxifene (EVISTA) 60 MG tablet Take 60 mg by mouth daily.     rosuvastatin (CRESTOR) 5 MG tablet TAKE 1 TABLET BY MOUTH EVERY DAY FOR 30 DAYS     Omega-3 Fatty Acids (FISH OIL) 1000 MG CAPS 2,000 mg 1 (one) hour before bedtime. (Patient not taking: Reported on 02/17/2024)     No current facility-administered medications for this visit.    ALLERGIES: Patient has no known allergies.  Family History  Adopted: Yes    Review of Systems  All other systems reviewed and are negative.   PHYSICAL EXAM:  BP 124/82 (BP Location: Left Arm, Patient Position: Sitting)   Pulse 87   Ht 5\' 4"  (1.626 m)   Wt 150 lb (68 kg)   LMP 09/29/2005   SpO2 98%   BMI 25.75  kg/m     General appearance: alert, cooperative and appears stated age Head: normocephalic, without obvious abnormality, atraumatic Neck: no adenopathy, supple, symmetrical, trachea midline and thyroid normal to inspection and palpation Lungs: clear to auscultation bilaterally Breasts: normal appearance, no masses or tenderness, No nipple retraction or dimpling, No nipple discharge or bleeding, No axillary adenopathy Heart: regular rate and rhythm Abdomen: soft, non-tender; no masses, no organomegaly Extremities: extremities normal, atraumatic, no cyanosis or edema Skin: skin color, texture, turgor normal. No rashes or lesions Lymph nodes: cervical, supraclavicular, and axillary nodes normal. Neurologic: grossly normal  Pelvic: External genitalia:  no lesions              No abnormal inguinal nodes palpated.              Urethra:  normal appearing urethra with no masses, tenderness or lesions              Bartholins and Skenes: normal                 Vagina: normal appearing vagina with normal color and discharge, no lesions              Cervix: no lesions.  Cervix flush with vagina.  Cream wiped away.               Pap taken: yes Bimanual Exam:  Uterus:  normal size, contour, position, consistency, mobility, non-tender              Adnexa: no mass, fullness, tenderness              Rectal exam: yes.  Confirms.              Anus:  normal sphincter tone, no lesions  Chaperone was present for exam:  Cottie Diss, CMA  ASSESSMENT: Well woman visit with gynecologic exam. 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  Negative margins.  ASCUS, neg HR HPV pap in 2022.  Colposcopy in 2022 showing LGSIL. Cervical cancer screening today. Vaginal atrophy.  Hx mastitis right breast.  Osteoporosis.  On Evista.  Followed by ortho.  PHQ-9: 0  PLAN: Mammogram screening in June. Self breast awareness reviewed. Pap and HRV collected:  yes Guidelines for Calcium , Vitamin D , regular  exercise program including cardiovascular and weight bearing  exercise. Medication refills:  vaginal estradiol  cream.  If this pap is normal and neg HR HPV, next cervical cancer screening in 3 years.  She will contact her orthopedist about ordering her bone density.  Labs with PCP.  Follow up:  yearly and prn.

## 2024-02-17 NOTE — Patient Instructions (Signed)

## 2024-02-24 ENCOUNTER — Ambulatory Visit: Payer: Self-pay | Admitting: Obstetrics and Gynecology

## 2024-02-24 LAB — CYTOLOGY - PAP
Adequacy: ABSENT
Comment: NEGATIVE
Diagnosis: NEGATIVE
High risk HPV: NEGATIVE

## 2024-02-29 ENCOUNTER — Other Ambulatory Visit: Payer: Self-pay | Admitting: Obstetrics and Gynecology

## 2024-02-29 DIAGNOSIS — Z1231 Encounter for screening mammogram for malignant neoplasm of breast: Secondary | ICD-10-CM

## 2024-03-03 ENCOUNTER — Encounter

## 2024-03-03 DIAGNOSIS — Z1231 Encounter for screening mammogram for malignant neoplasm of breast: Secondary | ICD-10-CM

## 2024-03-09 ENCOUNTER — Encounter

## 2024-03-09 DIAGNOSIS — Z1231 Encounter for screening mammogram for malignant neoplasm of breast: Secondary | ICD-10-CM

## 2024-03-16 ENCOUNTER — Ambulatory Visit
Admission: RE | Admit: 2024-03-16 | Discharge: 2024-03-16 | Disposition: A | Discharge status: Home / Self Care | Source: Ambulatory Visit

## 2024-03-16 DIAGNOSIS — Z1231 Encounter for screening mammogram for malignant neoplasm of breast: Secondary | ICD-10-CM

## 2024-03-18 ENCOUNTER — Ambulatory Visit: Payer: Self-pay | Admitting: Obstetrics and Gynecology

## 2025-02-22 ENCOUNTER — Ambulatory Visit: Admitting: Obstetrics and Gynecology

## 2025-03-01 ENCOUNTER — Ambulatory Visit: Admitting: Obstetrics and Gynecology
